# Patient Record
Sex: Female | Born: 1985 | Hispanic: Yes | Marital: Single | Smoking: Never smoker
Health system: Southern US, Community
[De-identification: ages and names within clinical notes are randomized; demographics above are authoritative.]

## PROBLEM LIST (undated history)

## (undated) DIAGNOSIS — Z789 Other specified health status: Secondary | ICD-10-CM

## (undated) HISTORY — PX: NO PAST SURGERIES: SHX2092

---

## 2018-03-26 NOTE — L&D Delivery Note (Addendum)
Upon arrival to L&D from [facility], patient 10cm/100/+1, intact. Intermittent urge to push. CNM waited for arrival of NICU team and AROM performed with large amount of bloody fluid. Patient pushed one time to deliver. Infant initially with vigorous cry and good tone. Cord clamping delayed by 60 second and clamped and cut by CNM. Infant taken to warmer for assessment by NICU team. See NICU note.   Delivery Note At 7:01 PM a viable female was delivered via Vaginal, Spontaneous (Presentation:  Right Occiput Anterior).  APGAR: 8,9, weight pending.   Placenta status: Spontaneous, Intact.  Cord: 3 vessels with the following complications: None.   Anesthesia: None Episiotomy: None Lacerations: None Suture Repair: n/a Est. Blood Loss (mL): 331  Brisk bright red bleeding immediately after delivery of placenta. Uterus boggy. IV pitocin and methergine given. Clots removed from lower uterine segment. Initially firm but on reassesment by RN, uterus boggy again. Buccal cytotec and TXA given. Uterus now firm with scant bleeding.   Mom to postpartum.  Baby to NICU.  Wende Mott CNM 03/22/2019, 7:32 PM

## 2018-09-30 ENCOUNTER — Encounter (HOSPITAL_COMMUNITY): Payer: Self-pay | Admitting: *Deleted

## 2018-09-30 ENCOUNTER — Inpatient Hospital Stay (HOSPITAL_COMMUNITY): Payer: Self-pay

## 2018-09-30 ENCOUNTER — Inpatient Hospital Stay (HOSPITAL_COMMUNITY)
Admission: AD | Admit: 2018-09-30 | Discharge: 2018-09-30 | Disposition: A | Discharge status: Home / Self Care | Payer: Self-pay | Attending: Obstetrics and Gynecology | Admitting: Obstetrics and Gynecology

## 2018-09-30 ENCOUNTER — Other Ambulatory Visit: Payer: Self-pay

## 2018-09-30 DIAGNOSIS — Z3A09 9 weeks gestation of pregnancy: Secondary | ICD-10-CM | POA: Insufficient documentation

## 2018-09-30 DIAGNOSIS — O209 Hemorrhage in early pregnancy, unspecified: Secondary | ICD-10-CM | POA: Insufficient documentation

## 2018-09-30 DIAGNOSIS — O98811 Other maternal infectious and parasitic diseases complicating pregnancy, first trimester: Secondary | ICD-10-CM | POA: Insufficient documentation

## 2018-09-30 DIAGNOSIS — O98819 Other maternal infectious and parasitic diseases complicating pregnancy, unspecified trimester: Secondary | ICD-10-CM

## 2018-09-30 DIAGNOSIS — B373 Candidiasis of vulva and vagina: Secondary | ICD-10-CM | POA: Insufficient documentation

## 2018-09-30 HISTORY — DX: Other specified health status: Z78.9

## 2018-09-30 LAB — WET PREP, GENITAL
Clue Cells Wet Prep HPF POC: NONE SEEN
Sperm: NONE SEEN
Trich, Wet Prep: NONE SEEN

## 2018-09-30 LAB — URINALYSIS, ROUTINE W REFLEX MICROSCOPIC
Bilirubin Urine: NEGATIVE
Glucose, UA: NEGATIVE mg/dL
Hgb urine dipstick: NEGATIVE
Ketones, ur: NEGATIVE mg/dL
Leukocytes,Ua: NEGATIVE
Nitrite: NEGATIVE
Protein, ur: NEGATIVE mg/dL
Specific Gravity, Urine: 1.016 (ref 1.005–1.030)
pH: 7 (ref 5.0–8.0)

## 2018-09-30 LAB — TYPE AND SCREEN
ABO/RH(D): O POS
Antibody Screen: NEGATIVE

## 2018-09-30 LAB — CBC
HCT: 39.4 % (ref 36.0–46.0)
Hemoglobin: 13.4 g/dL (ref 12.0–15.0)
MCH: 31.8 pg (ref 26.0–34.0)
MCHC: 34 g/dL (ref 30.0–36.0)
MCV: 93.6 fL (ref 80.0–100.0)
Platelets: 276 10*3/uL (ref 150–400)
RBC: 4.21 MIL/uL (ref 3.87–5.11)
RDW: 13.2 % (ref 11.5–15.5)
WBC: 8.1 10*3/uL (ref 4.0–10.5)
nRBC: 0 % (ref 0.0–0.2)

## 2018-09-30 LAB — POCT PREGNANCY, URINE: Preg Test, Ur: POSITIVE — AB

## 2018-09-30 LAB — HCG, QUANTITATIVE, PREGNANCY: hCG, Beta Chain, Quant, S: 93366 m[IU]/mL — ABNORMAL HIGH (ref <5)

## 2018-09-30 LAB — ABO/RH: ABO/RH(D): O POS

## 2018-09-30 MED ORDER — TERCONAZOLE 0.4 % VA CREA: 1.0000 | TOPICAL_CREAM | Freq: Every day | VAGINAL | 0 refills | Status: DC

## 2018-09-30 NOTE — Discharge Instructions (Signed)
Candidiasis vaginal en los adultos Vaginal Yeast infection, Adult  La infeccin mictica vaginal es una afeccin que causa secrecin vaginal y tambin dolor, hinchazn y enrojecimiento (inflamacin) de la vagina. Esta es una enfermedad frecuente. Algunas mujeres contraen esta infeccin con frecuencia. Cules son las causas? La causa de la infeccin es un cambio en el equilibrio normal de los hongos (cndida) y las bacterias que viven en la vagina. Esta alteracin deriva en el crecimiento excesivo de los hongos, lo que causa la inflamacin. Qu incrementa el riesgo? La afeccin es ms probable en las mujeres que tienen estas caractersticas:  Toman antibiticos.  Tienen diabetes.  Toman anticonceptivos orales.  Estn embarazadas.  Se hacen duchas vaginales con frecuencia.  Tienen debilitado el sistema de defensa del organismo (sistema inmunitario).  Han estado tomando medicamentos con corticoesteroides durante mucho tiempo.  Usan ropa ajustada con frecuencia. Cules son los signos o los sntomas? Los sntomas de esta afeccin incluyen los siguientes:  Secrecin vaginal blanca, cremosa y espesa.  Hinchazn, picazn, enrojecimiento e irritacin de la vagina. Los labios de la vagina (vulva) tambin se pueden infectar.  Dolor o ardor al orinar.  Dolor durante las relaciones sexuales. Cmo se diagnostica? Esta afeccin se diagnostica en funcin de lo siguiente:  Sus antecedentes mdicos.  Un examen fsico.  Examen plvico. El mdico examinar una muestra de la secrecin vaginal con un microscopio. Probablemente el mdico enve esta muestra al laboratorio para analizarla y confirmar el diagnstico. Cmo se trata? Esta afeccin se trata con medicamentos. Los medicamentos pueden ser recetados o de venta libre. Podrn indicarle que use uno o ms de lo siguiente:  Medicamentos que se toman por boca (orales).  Medicamentos que se aplican como una crema (tpicos).   Medicamentos que se colocan directamente en la vagina (vulos vaginales). Siga estas indicaciones en su casa:  Estilo de vida  No tenga relaciones sexuales hasta que el mdico lo autorice. Comunique a su compaero sexual que tiene una infeccin por hongos. Esa persona debera visitar a su mdico y preguntarle si debera recibir tratamiento tambin.  No use ropa ajustada, como pantimedias o pantalones ajustados.  Use ropa interior de algodn, que permite el paso del aire. Indicaciones generales  Tome o aplquese los medicamentos de venta libre y los recetados solamente como se lo haya indicado el mdico.  Consuma ms yogur. Esto puede ayudar a evitar la recurrencia de la candidiasis.  No use tampones hasta que el mdico la autorice.  Intente darse un bao de asiento para aliviar las molestias. Se trata de un bao de agua tibia que se toma mientras se est sentado. El agua solo debe llegar hasta las caderas y cubrir las nalgas. Hgalo 3o 4veces al da o como se lo haya indicado el mdico.  No se haga duchas vaginales.  Si tiene diabetes, mantenga bajo control los niveles de azcar en la sangre.  Concurra a todas las visitas de control como se lo haya indicado el mdico. Esto es importante. Comunquese con un mdico si:  Tiene fiebre.  Los sntomas desaparecen y luego vuelven a aparecer.  Los sntomas no mejoran con el tratamiento.  Sus sntomas empeoran.  Aparecen nuevos sntomas.  Aparecen ampollas alrededor o adentro de la vagina.  Le sale sangre de la vagina y no est menstruando.  Siente dolor en el abdomen. Resumen  La infeccin mictica vaginal es una afeccin que causa secrecin y tambin dolor, hinchazn y enrojecimiento (inflamacin) de la vagina.  Esta afeccin se trata con medicamentos. Los   medicamentos pueden ser recetados o de venta libre.  Tome o aplquese los medicamentos de venta libre y los recetados solamente como se lo haya indicado el mdico.  No  se haga duchas vaginales. No tenga relaciones sexuales ni use tampones hasta que el mdico la autorice.  Comunquese con un mdico si los sntomas no mejoran con el tratamiento o si los sntomas desaparecen y luego regresan. Esta informacin no tiene como fin reemplazar el consejo del mdico. Asegrese de hacerle al mdico cualquier pregunta que tenga. Document Released: 12/20/2004 Document Revised: 09/19/2017 Document Reviewed: 09/19/2017 Elsevier Patient Education  2020 [employer]  

## 2018-09-30 NOTE — MAU Provider Note (Signed)
History     CSN: [id number]  Arrival date and time: 09/30/18 1110   First Provider Initiated Contact with Patient 09/30/18 1239      Chief Complaint  Patient presents with  . Vaginal Bleeding  . Abdominal Pain   Deanna Obrien is a 33 y.o. G3P2002 at 1848w1d by definite LMP .  She presents today for Vaginal Bleeding and Abdominal Pain.  She states she had a positive pregnancy test at home about one month ago and started having bleeding yesterday.  She states it was "two drops of blood and wetness."  She states she had sexual activity 48 hours ago and did not have any bleeding today.  Patient reports she had some pain in her lower abdomen and in her waist line, but none currently.  She states she did not take any medication for the pain.      OB History    Gravida  3   Para  2   Term  2   Preterm      AB      Living        SAB      TAB      Ectopic      Multiple      Live Births              Past Medical History:  Diagnosis Date  . Medical history non-contributory     Past Surgical History:  Procedure Laterality Date  . NO PAST SURGERIES      Family History  Problem Relation Age of Onset  . Cirrhosis Father     Social History   Tobacco Use  . Smoking status: Never Smoker  . Smokeless tobacco: Never Used  Substance Use Topics  . Alcohol use: Not Currently  . Drug use: Never    Allergies: No Known Allergies  No medications prior to admission.    Review of Systems  Constitutional: Negative for chills and fever.  Eyes: Negative for visual disturbance.  Respiratory: Negative for cough and shortness of breath.   Gastrointestinal: Positive for nausea. Negative for abdominal pain, constipation, diarrhea and vomiting.  Genitourinary: Positive for vaginal discharge (White, thin, and nonodorous). Negative for difficulty urinating, dysuria and vaginal bleeding.  Neurological: Negative for dizziness, light-headedness and headaches.   Physical  Exam   Blood pressure 134/78, pulse 74, temperature 98.8 F (37.1 C), temperature source Oral, resp. rate 17, weight 74 kg, last menstrual period 07/28/2018, SpO2 100 %.  Physical Exam  Constitutional: She is oriented to person, place, and time. She appears well-developed and well-nourished.  HENT:  Head: Normocephalic and atraumatic.  Eyes: Conjunctivae are normal.  Neck: Normal range of motion.  Cardiovascular: Normal rate.  Respiratory: Effort normal.  GI: Soft.  Genitourinary: Cervix exhibits friability. Cervix exhibits no discharge.    Vaginal discharge present.     No vaginal bleeding.  No bleeding in the vagina.    Genitourinary Comments: Speculum Exam: -Vaginal Vault: Pink mucosa.  Small amt thin curdy white discharge -wet prep collected -Cervix:Pink, no lesions, cysts, or polyps.  Appears closed. No active bleeding from os-GC/CT collected. Friable bleeding noted -Bimanual Exam: Closed   Musculoskeletal: Normal range of motion.  Neurological: She is alert and oriented to person, place, and time.  Skin: Skin is warm and dry.  Psychiatric: She has a normal mood and affect. Her behavior is normal.    MAU Course  Procedures Results for orders placed or performed during the hospital encounter  of 09/30/18 (from the past 24 hour(s))  Pregnancy, urine POC     Status: Abnormal   Collection Time: 09/30/18 12:00 PM  Result Value Ref Range   Preg Test, Ur POSITIVE (A) NEGATIVE  Urinalysis, Routine w reflex microscopic     Status: None   Collection Time: 09/30/18 12:07 PM  Result Value Ref Range   Color, Urine YELLOW YELLOW   APPearance CLEAR CLEAR   Specific Gravity, Urine 1.016 1.005 - 1.030   pH 7.0 5.0 - 8.0   Glucose, UA NEGATIVE NEGATIVE mg/dL   Hgb urine dipstick NEGATIVE NEGATIVE   Bilirubin Urine NEGATIVE NEGATIVE   Ketones, ur NEGATIVE NEGATIVE mg/dL   Protein, ur NEGATIVE NEGATIVE mg/dL   Nitrite NEGATIVE NEGATIVE   Leukocytes,Ua NEGATIVE NEGATIVE  CBC      Status: None   Collection Time: 09/30/18 12:15 PM  Result Value Ref Range   WBC 8.1 4.0 - 10.5 K/uL   RBC 4.21 3.87 - 5.11 MIL/uL   Hemoglobin 13.4 12.0 - 15.0 g/dL   HCT 39.4 36.0 - 46.0 %   MCV 93.6 80.0 - 100.0 fL   MCH 31.8 26.0 - 34.0 pg   MCHC 34.0 30.0 - 36.0 g/dL   RDW 13.2 11.5 - 15.5 %   Platelets 276 150 - 400 K/uL   nRBC 0.0 0.0 - 0.2 %   US Ob Transvaginal  Result Date: 09/30/2018 CLINICAL DATA:  Vaginal bleeding, first trimester of pregnancy. EXAM: TRANSVAGINAL OB ULTRASOUND TECHNIQUE: Transvaginal ultrasound was performed for complete evaluation of the gestation as well as the maternal uterus, adnexal regions, and pelvic cul-de-sac. COMPARISON:  None. FINDINGS: Intrauterine gestational sac: Single visualized. Yolk sac:  Visualized. Embryo:  Visualized. Cardiac Activity: Visualized. Heart Rate: 176 bpm CRL:   21.8 mm   8 w 5 d                  [place] EDC: May 07, 2019. Subchorionic hemorrhage:  None visualized. Maternal uterus/adnexae: Probable corpus luteum cyst seen in right ovary. Left ovary is unremarkable. Minimal free fluid is noted which most likely is physiologic. Possible 1.9 cm paraovarian cyst is noted. IMPRESSION: Single live intrauterine gestation of 8 weeks 5 days. Electronically Signed   By: Marijo Conception M.D.   On: 09/30/2018 13:59    MDM Pelvic Exam with cultures Labs: UA, CBC, T&S, hCG, Wet prep, and GC/CT  Assessment and Plan  33 year old G3P2002 at 9.1 weeks by LMP Vaginal Bleeding-Resolved Abdominal Pain-Resolved Candidiasis of Vagina Friable Cervix  -Exam findings discussed. -Patient informed of apparent candidiasis of vagina. -Informed of friable cervix and how this may contribute to light spotting after sex.  -Labs collected and sent. -Will send for [place]. -Will await results.   Follow Up (2:15 PM)  -Wet prep returns significant for yeast buds -[place] returns with dates c/w LMP and no s/s of [employer] -Nurse instructed to inform patient of  findings and give bleeding precautions. -Encouraged to call or return to MAU if symptoms worsen or with the onset of new symptoms. -Discharged to home in stable condition.  Maryann Conners MSN, CNM 09/30/2018, 12:39 PM

## 2018-09-30 NOTE — MAU Note (Signed)
Called pt at [phone]  At registration desk, now waiting on translator in triage.

## 2018-09-30 NOTE — MAU Note (Signed)
Plan explained to pt, will collect urine- rpt preg test.  Pt to lobby to wait on results.  May draw blood work while waiting.

## 2018-09-30 NOTE — MAU Note (Signed)
Last period was May 4,  +HPT.  Last night she was not feeling well, felt like something was coming down, had a small amt of blood. Very little pain in lower abd, no bleeding today.

## 2018-10-01 LAB — GC/CHLAMYDIA PROBE AMP (~~LOC~~) NOT AT ARMC
Chlamydia: NEGATIVE
Neisseria Gonorrhea: NEGATIVE

## 2018-10-07 ENCOUNTER — Telehealth: Payer: Self-pay | Admitting: Family Medicine

## 2018-10-07 NOTE — Telephone Encounter (Signed)
Patient called in needing a Spanish interpreter. Spanish interpreter ID # [id number] was used for the call. Patient stated that she went to MAU because she is pregnant and she was bleeding. Patient stated that they gave her some medicine. Patient stated that she is having some bleeding again today. Spoke with Pam about this matter, and she stated that the patient did not have to go to MAU again is she is not soaking through pads and only has bleeding when she wipes. Patient was instructed of this information and instructed if she feels as if she needs to return to MAU than she can but right now it is not recommended. Patient verbalized understanding. Patient was scheduled to start prenatal care at our office. Intake is scheduled for 7/15 @ 10:30. Patient instructed that the visit is virtual and the [facility] app was downloaded while we were on the phone.

## 2018-10-08 ENCOUNTER — Inpatient Hospital Stay (HOSPITAL_COMMUNITY)
Admission: AD | Admit: 2018-10-08 | Discharge: 2018-10-08 | Disposition: A | Discharge status: Home / Self Care | Payer: Self-pay | Attending: Obstetrics and Gynecology | Admitting: Obstetrics and Gynecology

## 2018-10-08 ENCOUNTER — Encounter (HOSPITAL_COMMUNITY): Payer: Self-pay

## 2018-10-08 ENCOUNTER — Other Ambulatory Visit: Payer: Self-pay

## 2018-10-08 ENCOUNTER — Ambulatory Visit (INDEPENDENT_AMBULATORY_CARE_PROVIDER_SITE_OTHER): Payer: Self-pay | Admitting: *Deleted

## 2018-10-08 DIAGNOSIS — Z3A10 10 weeks gestation of pregnancy: Secondary | ICD-10-CM | POA: Insufficient documentation

## 2018-10-08 DIAGNOSIS — O209 Hemorrhage in early pregnancy, unspecified: Secondary | ICD-10-CM | POA: Insufficient documentation

## 2018-10-08 DIAGNOSIS — O4690 Antepartum hemorrhage, unspecified, unspecified trimester: Secondary | ICD-10-CM

## 2018-10-08 LAB — URINALYSIS, ROUTINE W REFLEX MICROSCOPIC
Bilirubin Urine: NEGATIVE
Glucose, UA: NEGATIVE mg/dL
Hgb urine dipstick: NEGATIVE
Ketones, ur: NEGATIVE mg/dL
Leukocytes,Ua: NEGATIVE
Nitrite: NEGATIVE
Protein, ur: NEGATIVE mg/dL
Specific Gravity, Urine: 1.012 (ref 1.005–1.030)
pH: 7 (ref 5.0–8.0)

## 2018-10-08 NOTE — Discharge Instructions (Signed)
Primer trimestre de embarazo °First Trimester of Pregnancy °El primer trimestre de embarazo se extiende desde la semana 1 hasta el final de la semana 13 (mes 1 al mes 3). Una semana después de que un espermatozoide fecunda un óvulo, este se implantará en la pared uterina. Este embrión comenzará a desarrollarse hasta convertirse en un bebé. Sus genes y los de su pareja forman el bebé. Los genes del varón determinan si será un niño o una niña. Entre la semana 6 y la 8, se forman los ojos y el rostro, y los latidos del corazón pueden verse en una ecografía. Al final de las 12 semanas, todos los órganos del bebé están formados. °Ahora que está embarazada, querrá hacer todo lo que esté a su alcance para tener un bebé sano. Dos de las cosas más importantes son tener un buen cuidado prenatal y seguir las indicaciones del médico. El cuidado prenatal incluye toda la asistencia médica que usted recibe antes del nacimiento del bebé. Esto ayudará a prevenir, detectar y tratar cualquier problema durante el embarazo y el parto. °Durante el primer trimestre, ocurren cambios en el cuerpo. °Su organismo atraviesa por muchos cambios durante el embarazo. Estos cambios varían de una mujer a otra. °· Al principio, puede aumentar o bajar algunos kilos. °· Puede tener malestar estomacal (náuseas) y vomitar. Si no puede controlar los vómitos, llame al médico. °· Puede cansarse con facilidad. °· Es posible que tenga dolores de cabeza que pueden aliviarse con ciertos medicamentos. Todos los medicamentos que tome deben estar aprobados por el médico. °· Puede orinar con mayor frecuencia. El dolor al orinar puede significar que usted tiene una infección de la vejiga. °· Debido al embarazo, puede tener acidez estomacal. °· Puede estar estreñida, ya que ciertas hormonas enlentecen los movimientos de los músculos que empujan las heces a través del intestino. °· Pueden aparecer hemorroides o hincharse las venas (venas varicosas). °· Las mamas  pueden empezar a agrandarse y estar sensibles. Los pezones pueden sobresalir más, y el tejido que los rodea (aréola) tornarse más oscuro. °· Las encías pueden sangrar y estar sensibles al cepillado y al hilo dental. °· Pueden aparecer zonas oscuras o manchas (cloasma, máscara del embarazo) en el rostro. Esto probablemente se atenuará después del nacimiento del bebé. °· Los períodos menstruales se interrumpirán. °· Tal vez no tenga apetito. °· Puede sentir un fuerte deseo de consumir ciertos alimentos. °· Puede tener cambios a nivel emocional día a día, por ejemplo, por momentos puede estar emocionada por el embarazo y por otros preocuparse porque algo pueda salir mal con el embarazo o el bebé. °· Tendrá sueños más vívidos y extraños. °· Tal vez haya cambios en el cabello. Esto cambios pueden incluir su engrosamiento, crecimiento rápido y cambios en la textura. Además, a algunas mujeres se les cae el cabello durante o después del embarazo, o tienen el cabello seco o fino. Lo más probable es que el cabello se le normalice después del nacimiento del bebé. °Qué debe esperar en las visitas prenatales °Durante una visita prenatal de rutina: °· La pesarán para asegurarse de que usted y el bebé están creciendo normalmente. °· Le tomarán la presión arterial. °· Le medirán el abdomen para controlar el desarrollo del bebé. °· Se escucharán los latidos cardíacos fetales entre las semanas 10 y 14 de embarazo. °· Se analizarán los resultados de los estudios solicitados en visitas anteriores. °El médico puede preguntarle lo siguiente: °· Cómo se siente. °· Si siente los movimientos del bebé. °· Si ha tenido síntomas anormales, como pérdida de líquido, sangrado,   dolores de cabeza intensos o cólicos abdominales. °· Si está consumiendo algún producto que contenga tabaco, como cigarrillos, tabaco de mascar y cigarrillos electrónicos. °· Si tiene alguna pregunta. °Otros estudios que pueden realizarse durante el primer trimestre  incluyen lo siguiente: °· Análisis de sangre para determinar el grupo sanguíneo y detectar la presencia de infecciones previas. Las pruebas también se utilizarán para determinar si tiene bajo nivel de hierro (anemia) y proteínas en los glóbulos rojos (anticuerpos Rh). En función de sus factores de riesgo, o si ya tuvo diabetes durante un embarazo anterior, le pueden hacer pruebas para determinar si tiene un nivel alto de azúcar en la sangre, algo que puede afectar a embarazadas (diabetes gestacional). °· Análisis de orina para detectar infecciones, diabetes o proteínas en la orina. °· Una ecografía para confirmar que el bebé crece y se desarrolla correctamente. °· Estudios fetales para detectar problemas de la médula espinal (espina bífida) y síndrome de Down. °· Prueba del VIH (virus de inmunodeficiencia humana). Los exámenes prenatales de rutina incluyen la prueba de detección del VIH, a menos que decida no realizársela. °· Es posible que necesite otras pruebas adicionales. °Siga estas instrucciones en su casa: °Medicamentos °· Siga las indicaciones del médico en relación con el uso de medicamentos. Durante el embarazo, hay medicamentos que pueden tomarse y otros que no. °· Tome vitaminas prenatales que contengan por lo menos 600 microgramos (?g) de ácido fólico. °· Si está estreñida, tome un laxante suave, si el médico lo autoriza. °Comida y bebida ° °· Lleve una dieta equilibrada que incluya gran cantidad de frutas y verduras frescas, cereales integrales, buenas fuentes de proteínas como carnes magras, huevos o tofu, y lácteos descremados. El médico la ayudará a determinar la cantidad de peso que puede aumentar. °· No coma carne cruda ni quesos sin cocinar. Estos elementos contienen gérmenes que pueden causar defectos congénitos en el bebé. °· La ingesta diaria de cuatro o cinco comidas pequeñas en lugar de tres comidas abundantes puede ayudar a aliviar las náuseas y los vómitos. Si empieza a tener náuseas,  comer algunas galletas saladas puede ser de ayuda. Beber líquidos entre las comidas, en lugar de tomarlos durante las comidas, también puede ayudar a aliviar las náuseas y los vómitos. °· Limite el consumo de alimentos con alto contenido de grasas y azúcares procesados, como alimentos fritos o dulces. °· Para evitar el estreñimiento: °? Consuma alimentos ricos en fibra, como frutas y verduras frescas, cereales integrales y frijoles. °? Beba suficiente líquido para mantener la orina clara o de color amarillo pálido. °Actividad °· Haga ejercicio solamente como se lo haya indicado el médico. La mayoría de las mujeres pueden continuar su rutina de ejercicios durante el embarazo. Intente realizar como mínimo 30 minutos de actividad física por lo menos 5 días a la semana. El ejercicio la ayudará a: °? Controlar su peso. °? Mantenerse en forma. °? Estar preparada para el trabajo de parto y el parto. °· Los dolores, los cólicos en la parte baja del abdomen o los calambres en la cintura son un buen indicio de que debe dejar de hacer ejercicios. Consulte al médico antes de seguir haciendo ejercicios con normalidad. °· Intente no estar de pie durante mucho tiempo. Mueva las piernas con frecuencia si debe estar de pie en un lugar durante mucho tiempo. °· Evite levantar pesos excesivos. °· Use zapatos de tacones bajos y mantenga una buena postura. °· Puede seguir teniendo relaciones sexuales, salvo que el médico le indique lo contrario. °Alivio del dolor y del malestar °·   Use un sostén que le brinde buen soporte para aliviar el dolor de mamas. °· Dese baños de asiento con agua tibia para aliviar el dolor o las molestias causadas por las hemorroides. Use una crema para las hemorroides si el médico la autoriza. °· Descanse con las piernas elevadas si tiene calambres o dolor de cintura. °· Si tiene venas varicosas en las piernas, use medias de descanso. Eleve los pies durante 15 minutos, 3 o 4 veces por día. Limite el consumo de  sal en su dieta. °Cuidados prenatales °· Programe las visitas prenatales para la semana 12 de embarazo. Generalmente se programan cada mes al principio y se hacen más frecuentes en los 2 últimos meses antes del parto. °· Escriba sus preguntas. Llévelas cuando concurra a las visitas prenatales. °· Concurra a todas las visitas prenatales tal como se lo haya indicado el médico. Esto es importante. °Seguridad °· Use el cinturón de seguridad en todo momento mientras conduce. °· Haga una lista de los números de teléfono de emergencia, que incluya los números de teléfono de familiares, amigos, el hospital y los departamentos de policía y bomberos. °Instrucciones generales °· Pídale al médico que la derive a clases de educación prenatal en su localidad. Debe comenzar a tomar las clases antes de que empiece el mes 6 de embarazo. °· Pida ayuda si tiene necesidades nutricionales o de asesoramiento durante el embarazo. El médico puede aconsejarla o derivarla a especialistas para que la ayuden con diferentes necesidades. °· No se dé baños de inmersión en agua caliente, baños turcos ni saunas. °· No se haga duchas vaginales ni use tampones o toallas higiénicas perfumadas. °· No mantenga las piernas cruzadas durante mucho tiempo. °· Evite el contacto con las bandejas sanitarias de los gatos y la tierra que estos animales usan. Estos elementos contienen bacterias que pueden causar defectos congénitos al bebé y la posible pérdida del feto debido a un aborto espontáneo o muerte fetal. °· No fume, no consuma hierbas ni medicamentos que no hayan sido recetados por el médico. Las sustancias químicas que estos productos contienen afectan la formación y el desarrollo del bebé. °· No consuma ningún producto que contenga nicotina o tabaco, como cigarrillos y cigarrillos electrónicos. Si necesita ayuda para dejar de fumar, consulte al médico. Puede recibir asesoramiento y otro tipo de recursos para dejar de fumar. °· Programe una cita con el  dentista. En su casa, lávese los dientes con un cepillo dental blando y pásese el hilo dental con suavidad. °Comuníquese con un médico si: °· Tiene mareos. °· Siente cólicos leves, presión en la pelvis o dolor persistente en el abdomen. °· Tiene náuseas, vómitos o diarrea persistentes. °· Observa una secreción vaginal con mal olor. °· Siente dolor al orinar. °· Observa más hinchazón en la cara, las manos, las piernas o los tobillos. °· Está expuesta a la quinta enfermedad o a la varicela. °· Está expuesta al sarampión alemán (rubéola) y nunca lo había tenido. °Solicite ayuda de inmediato si: °· Tiene fiebre. °· Tiene una pérdida de líquido por la vagina. °· Tiene sangrado o pequeñas pérdidas vaginales. °· Siente dolor intenso o cólicos en el abdomen. °· Sube o baja de peso rápidamente. °· Vomita sangre de color rojo brillante o una sustancia similar a los granos de café. °· Dolor de cabeza intenso. °· Le falta el aire. °· Sufre cualquier tipo de traumatismo, por ejemplo, debido a una caída o un accidente automovilístico. °Resumen °· El primer trimestre de embarazo se extiende desde la semana 1 hasta el final de la   semana 13 (mes 1 al mes 3). °· Su organismo atraviesa por muchos cambios durante el embarazo. Estos cambios varían de una mujer a otra. °· Tendrá visitas prenatales de rutina. Durante esas visitas, el médico la examinará, hablará con usted acerca de los resultados de sus pruebas y le preguntará cómo se siente. °Esta información no tiene como fin reemplazar el consejo del médico. Asegúrese de hacerle al médico cualquier pregunta que tenga. °Document Released: 12/20/2004 Document Revised: 06/15/2016 Document Reviewed: 06/15/2016 °Elsevier Patient Education © 2020 [employer] ° °

## 2018-10-08 NOTE — Progress Notes (Signed)
10:27am Unclear per chart review if she speaks Spanish and needs interpreter or speaks [place]-  I called Corrine and confirmed she speak Spanish and needs interpreter . 10:29 I called her back with interpreter and we dialed her number twice and heard a message voicemail not set up. Linda,RN 10: 68 I called back again with [employer] interepreter and she is not answering and hear message voicemail not set up. I called Quinetta also and heard message voicemail not set up. Linda,RN 10:50 I called back again without interpreter and she answered and said no speak [place]- I said calling from [facility]- I am trying to call you with interpreter and will call back with interpreter -please answer your phone- will be different number. Linda,RN 10:52 I called with [employer] and she answered and we began visit. I connected with  Bearl Mulberry on 10/08/18 at 10:30 AM EDT by telephone and verified that I am speaking with the correct person using two identifiers.   I discussed the limitations, risks, security and privacy concerns of performing an evaluation and management service by telephone and the availability of in person appointments. I also discussed with the patient that there may be a patient responsible charge related to this service. The patient expressed understanding and agreed to proceed. I confirmed EDD and that she had bleeding 7/6 and 7/7 and went to MAU and no bleeding since then until 10/06/18 . States on 7/13 had a little bit of bleeding. Then 10/07/18 when wiped had blood for 3 hours everytime when she went to the bathroom, but none on panties. Then today when she woke up she felt something and went to bathroom and was bleeding- states it lasted 2- 3 hours then stopped.  States she feels pain in her waist when she is bleeding. She confimed that actually yesterday was not just spotting but had bleeding also for 2-3 hours.  States the pain is mild. She states the bleeding is bright red.  I  advised her to go to MAU for evaluation for possible miscarriage as she is having more than spotting and is having pain. It took several minutes of asking/ answering questions to clarify her bleeding.  She voices understanding.  Linda,RN 10/08/2018  10:54 AM

## 2018-10-08 NOTE — Progress Notes (Signed)
Patient seen and assessed by nursing staff during this encounter. I have reviewed the chart and agree with the documentation and plan.  Aletha Halim, MD 10/08/2018 2:27 PM

## 2018-10-08 NOTE — MAU Note (Signed)
Deanna Obrien is a 33 y.o. at [redacted]w[redacted]d here in MAU reporting: started having vaginal bleeding since Sunday, it is intermittent. Notices it when she wipes in the bathroom. States every once in a while she feels like something is coming down, early on Monday the bleeding was bright red with clots, no clots since then. Has some pain when she feels some blood is coming down, pain is intermittent. No recent IC  Onset of complaint: since Sunday   Pain score: 4/10  Vitals:   10/08/18 1205  BP: 114/70  Pulse: 74  Resp: 18  Temp: 98.7 F (37.1 C)  SpO2: 100%     [id number] attempted, unable to obtain FHT  Lab orders placed from triage: UA

## 2018-10-08 NOTE — MAU Note (Signed)
Spanish interpretor notified of pt arrival, Deanna Obrien, states she is on her way

## 2018-10-08 NOTE — MAU Note (Signed)
Norman Herrlich CNM informs this RN that she saw [place] with bedside u/s. Plans to dc pt home

## 2018-10-08 NOTE — MAU Provider Note (Signed)
History     CSN: [id number]  Arrival date and time: 10/08/18 1129   First Provider Initiated Contact with Patient 10/08/18 1213      Chief Complaint  Patient presents with  . Abdominal Pain  . Vaginal Bleeding   Deanna Obrien is a 33 y.o. G3P2000 at [redacted]w[redacted]d who presents today with vaginal bleeding. She states that she was seen here 8 days ago with bleeding. That stopped and then 3 days ago she started bleeding again. On Monday she passed a few clots about the size of a pea. She denies any pain at this time.   Vaginal Bleeding The patient's primary symptoms include vaginal bleeding. This is a new problem. Episode onset: 3 days ago. The problem occurs intermittently. The problem has been unchanged. She is pregnant. The vaginal discharge was bloody. The vaginal bleeding is lighter than menses. She has been passing clots (Monday she passed a few about the size of a pea. ). Nothing aggravates the symptoms. She has tried nothing for the symptoms. Menstrual history: 07/28/2018.    OB History    Gravida  3   Para  2   Term  2   Preterm      AB      Living        SAB      TAB      Ectopic      Multiple      Live Births              Past Medical History:  Diagnosis Date  . Medical history non-contributory     Past Surgical History:  Procedure Laterality Date  . NO PAST SURGERIES      Family History  Problem Relation Age of Onset  . Cirrhosis Father     Social History   Tobacco Use  . Smoking status: Never Smoker  . Smokeless tobacco: Never Used  Substance Use Topics  . Alcohol use: Not Currently  . Drug use: Never    Allergies: No Known Allergies  Medications Prior to Admission  Medication Sig Dispense Refill Last Dose  . terconazole (TERAZOL 7) 0.4 % vaginal cream Place 1 applicator vaginally at bedtime. 45 g 0     Review of Systems  Genitourinary: Positive for vaginal bleeding.   Physical Exam   Blood pressure 114/70, pulse 74,  temperature 98.7 F (37.1 C), temperature source Oral, resp. rate 18, height 5\' 1"  (1.549 m), weight 73.6 kg, last menstrual period 07/28/2018, SpO2 100 %.  Physical Exam  Nursing note and vitals reviewed. Constitutional: She is oriented to person, place, and time. She appears well-developed and well-nourished. No distress.  HENT:  Head: Normocephalic.  Cardiovascular: Normal rate.  Respiratory: Effort normal.  GI: Soft. There is no abdominal tenderness. There is no rebound.  Neurological: She is alert and oriented to person, place, and time.  Skin: Skin is warm and dry.  Psychiatric: She has a normal mood and affect.   Pt informed that the ultrasound is considered a limited OB ultrasound and is not intended to be a complete ultrasound exam.  Patient also informed that the ultrasound is not being completed with the intent of assessing for fetal or placental anomalies or any pelvic abnormalities.  Explained that the purpose of today's ultrasound is to assess for  viability.  Patient acknowledges the purpose of the exam and the limitations of the study.    +FHT 140 CRL 10 weeks 1 day which is consistent with prior  dating.  MAU Course  Procedures  MDM   Assessment and Plan   1. Vaginal bleeding in pregnancy, first trimester   2. [redacted] weeks gestation of pregnancy    Blood type O+  DC home 1st Trimester precautions  Bleeding precautions RX: none  Return to MAU as needed FU with OB as planned  Follow-up Information    [facility].   Specialty: Obstetrics and Gynecology Contact information: [address] [id number] [place] [id number] [phone]         Thressa ShellerHeather  DNP, CNM  10/08/18  12:32 PM

## 2018-10-09 ENCOUNTER — Ambulatory Visit (INDEPENDENT_AMBULATORY_CARE_PROVIDER_SITE_OTHER): Payer: Self-pay | Admitting: *Deleted

## 2018-10-09 ENCOUNTER — Other Ambulatory Visit: Payer: Self-pay

## 2018-10-09 DIAGNOSIS — Z3490 Encounter for supervision of normal pregnancy, unspecified, unspecified trimester: Secondary | ICD-10-CM

## 2018-10-09 DIAGNOSIS — O209 Hemorrhage in early pregnancy, unspecified: Secondary | ICD-10-CM | POA: Insufficient documentation

## 2018-10-09 NOTE — Progress Notes (Signed)
I connected with  Bearl Mulberry on 10/09/18 at 10:30 AM EDT by telephone and verified that I am speaking with the correct person using two identifiers.   I discussed the limitations, risks, security and privacy concerns of performing an evaluation and management service by telephone and virtually and the availability of in person appointments. I also discussed with the patient that there may be a patient responsible charge related to this service. The patient expressed understanding and agreed to proceed. We were not able to connect completely on Webex so we completed by telephone.   Anicia Leuthold,RN 10/09/2018  10:26 AM

## 2018-10-14 ENCOUNTER — Other Ambulatory Visit: Payer: Self-pay

## 2018-10-14 ENCOUNTER — Ambulatory Visit: Payer: Self-pay | Admitting: Clinical

## 2018-10-14 DIAGNOSIS — Z5329 Procedure and treatment not carried out because of patient's decision for other reasons: Secondary | ICD-10-CM

## 2018-10-14 DIAGNOSIS — Z91199 Patient's noncompliance with other medical treatment and regimen due to unspecified reason: Secondary | ICD-10-CM

## 2018-10-14 NOTE — BH Specialist Note (Signed)
Pt did not answer phone to set up video visit. Per Cheri Kearns, [phone], pt's phone rang with no voicemail, so no message left. Pt does not have MyChart, so no MyChart message left.   [place] via Telemedicine Video Visit  10/14/2018 Deanna Obrien [id number]   [name]

## 2018-10-21 ENCOUNTER — Ambulatory Visit (INDEPENDENT_AMBULATORY_CARE_PROVIDER_SITE_OTHER): Payer: Self-pay | Admitting: Advanced Practice Midwife

## 2018-10-21 ENCOUNTER — Encounter: Payer: Self-pay | Admitting: Advanced Practice Midwife

## 2018-10-21 ENCOUNTER — Other Ambulatory Visit: Payer: Self-pay

## 2018-10-21 VITALS — BP 122/73 | HR 76 | Temp 98.4°F | Wt 163.1 lb

## 2018-10-21 DIAGNOSIS — Z3490 Encounter for supervision of normal pregnancy, unspecified, unspecified trimester: Secondary | ICD-10-CM

## 2018-10-21 DIAGNOSIS — Z1151 Encounter for screening for human papillomavirus (HPV): Secondary | ICD-10-CM

## 2018-10-21 DIAGNOSIS — Z3491 Encounter for supervision of normal pregnancy, unspecified, first trimester: Secondary | ICD-10-CM

## 2018-10-21 DIAGNOSIS — Z3A12 12 weeks gestation of pregnancy: Secondary | ICD-10-CM

## 2018-10-21 DIAGNOSIS — Z124 Encounter for screening for malignant neoplasm of cervix: Secondary | ICD-10-CM

## 2018-10-21 NOTE — Progress Notes (Signed)
Pt has applied for Pregnancy Medicaid, will waite until approved to get her BP Cuff.

## 2018-10-21 NOTE — Progress Notes (Signed)
Subjective:   Deanna Obrien is a 33 y.o. G3P2002 at [redacted]w[redacted]d by LMP, early ultrasound being seen today for her first obstetrical visit.  Her obstetrical history is significant for none. Patient does intend to breast feed. Pregnancy history fully reviewed.  Patient reports no complaints.  Patient has been seen in MAU for a couple of visits with some bleeding. She states that has stopped now. Formal US on 09/30/2018, and bedside US on 10/08/2018 both with cardiac activity.   HISTORY: OB History  Gravida Para Term Preterm AB Living  3 2 2  0 0 2  SAB TAB Ectopic Multiple Live Births  0 0 0 0 2    # Outcome Date GA Lbr Len/2nd Weight Sex Delivery Anes PTL Lv  3 Current           2 Term 12/17/14   7 lb 11.5 oz (3.5 kg)  Vag-Spont None  LIV     Birth Comments: no complications  1 Term 99/8338 [redacted]w[redacted]d  5 lb 8.2 oz (2.5 kg)  Vag-Spont None  LIV     Birth Comments: no complications     Past Medical History:  Diagnosis Date  . Medical history non-contributory    Past Surgical History:  Procedure Laterality Date  . NO PAST SURGERIES     Family History  Problem Relation Age of Onset  . Cirrhosis Father   . Varicose Veins Father    Social History   Tobacco Use  . Smoking status: Never Smoker  . Smokeless tobacco: Never Used  Substance Use Topics  . Alcohol use: Not Currently  . Drug use: Never   No Known Allergies Current Outpatient Medications on File Prior to Visit  Medication Sig Dispense Refill  . Prenatal Vit-Fe Fumarate-FA (PRENATAL VITAMINS PO) Take 1 tablet by mouth daily.    Marland Kitchen terconazole (TERAZOL 7) 0.4 % vaginal cream Place 1 applicator vaginally at bedtime. 45 g 0   No current facility-administered medications on file prior to visit.     Review of Systems Pertinent items noted in HPI and remainder of comprehensive ROS otherwise negative.  Exam   Vitals:   10/21/18 1022  BP: 122/73  Pulse: 76  Temp: 98.4 F (36.9 C)  Weight: 163 lb 1.6 oz (74 kg)    Fetal Heart Rate (bpm): 155  Physical Exam  Constitutional: She is oriented to person, place, and time and well-developed, well-nourished, and in no distress. No distress.  Cardiovascular: Normal rate.  Pulmonary/Chest: Effort normal.  Abdominal: Soft. There is no abdominal tenderness. There is no rebound.  Genitourinary:    Genitourinary Comments:  External: no lesion Vagina: small amount of white discharge Cervix: pink, smooth, no CMT Uterus: AGA    Neurological: She is alert and oriented to person, place, and time.  Skin: Skin is warm and dry.  Psychiatric: Affect normal.  Nursing note and vitals reviewed.  +FHT 155 with doppler   Assessment:   Pregnancy: [id number] Patient Active Problem List   Diagnosis Date Noted  . Supervision of low-risk pregnancy 10/09/2018  . Vaginal bleeding in pregnancy, first trimester 10/09/2018     Plan:  1. Encounter for supervision of low-risk pregnancy, antepartum - Culture, OB Urine - Genetic Screening - Obstetric Panel, Including HIV - Cytology - PAP - AFP, Serum, Open Spina Bifida; Future   Initial labs drawn. Continue prenatal vitamins. Genetic Screening discussed, AFP and NIPS: ordered. Ultrasound discussed; fetal anatomic survey: ordered. Problem list reviewed and updated. The nature [facility] with multiple MDs and other Advanced Practice Providers was explained to patient; also emphasized that residents, students are part of our team. Routine obstetric precautions reviewed. 50% of 45 min visit spent in counseling and coordination of care. Return in about 8 weeks (around 12/16/2018) for Virtual OB visit .  Thressa ShellerHeather Sabella Traore DNP, CNM  10/21/18  10:57 AM

## 2018-10-22 LAB — OBSTETRIC PANEL, INCLUDING HIV
Antibody Screen: NEGATIVE
Basophils Absolute: 0 10*3/uL (ref 0.0–0.2)
Basos: 0 %
EOS (ABSOLUTE): 0.1 10*3/uL (ref 0.0–0.4)
Eos: 1 %
HIV Screen 4th Generation wRfx: NONREACTIVE
Hematocrit: 38.3 % (ref 34.0–46.6)
Hemoglobin: 12.6 g/dL (ref 11.1–15.9)
Hepatitis B Surface Ag: NEGATIVE
Immature Grans (Abs): 0 10*3/uL (ref 0.0–0.1)
Immature Granulocytes: 0 %
Lymphocytes Absolute: 1.7 10*3/uL (ref 0.7–3.1)
Lymphs: 22 %
MCH: 31.3 pg (ref 26.6–33.0)
MCHC: 32.9 g/dL (ref 31.5–35.7)
MCV: 95 fL (ref 79–97)
Monocytes Absolute: 0.7 10*3/uL (ref 0.1–0.9)
Monocytes: 9 %
Neutrophils Absolute: 5.4 10*3/uL (ref 1.4–7.0)
Neutrophils: 68 %
Platelets: 269 10*3/uL (ref 150–450)
RBC: 4.03 x10E6/uL (ref 3.77–5.28)
RDW: 13.1 % (ref 11.7–15.4)
RPR Ser Ql: NONREACTIVE
Rh Factor: POSITIVE
Rubella Antibodies, IGG: 1.59 index (ref >0.99)
WBC: 7.9 10*3/uL (ref 3.4–10.8)

## 2018-10-23 LAB — CYTOLOGY - PAP
Diagnosis: NEGATIVE
HPV: NOT DETECTED

## 2018-10-23 LAB — CULTURE, OB URINE

## 2018-10-23 LAB — URINE CULTURE, OB REFLEX

## 2018-10-24 ENCOUNTER — Encounter: Payer: Self-pay | Admitting: *Deleted

## 2018-10-29 ENCOUNTER — Encounter: Payer: Self-pay | Admitting: *Deleted

## 2018-11-03 ENCOUNTER — Encounter: Payer: Self-pay | Admitting: *Deleted

## 2018-12-08 ENCOUNTER — Other Ambulatory Visit: Payer: Self-pay

## 2018-12-08 ENCOUNTER — Other Ambulatory Visit (HOSPITAL_COMMUNITY): Payer: Self-pay | Admitting: *Deleted

## 2018-12-08 ENCOUNTER — Ambulatory Visit (HOSPITAL_COMMUNITY)
Admission: RE | Admit: 2018-12-08 | Discharge: 2018-12-08 | Disposition: A | Discharge status: Home / Self Care | Payer: Self-pay | Source: Ambulatory Visit | Attending: Obstetrics and Gynecology | Admitting: Obstetrics and Gynecology

## 2018-12-08 DIAGNOSIS — Z3689 Encounter for other specified antenatal screening: Secondary | ICD-10-CM

## 2018-12-08 DIAGNOSIS — Z3490 Encounter for supervision of normal pregnancy, unspecified, unspecified trimester: Secondary | ICD-10-CM | POA: Insufficient documentation

## 2018-12-08 DIAGNOSIS — O209 Hemorrhage in early pregnancy, unspecified: Secondary | ICD-10-CM | POA: Insufficient documentation

## 2018-12-08 DIAGNOSIS — Z363 Encounter for antenatal screening for malformations: Secondary | ICD-10-CM

## 2018-12-08 DIAGNOSIS — Z3A19 19 weeks gestation of pregnancy: Secondary | ICD-10-CM

## 2018-12-10 LAB — AFP, SERUM, OPEN SPINA BIFIDA
AFP MoM: 1.1
AFP Value: 49.7 ng/mL
Gest. Age on Collection Date: 19 weeks
Maternal Age At EDD: 34.9 yr
OSBR Risk 1 IN: 8933
Test Results:: NEGATIVE
Weight: 167 [lb_av]

## 2018-12-16 ENCOUNTER — Telehealth: Payer: Self-pay | Admitting: Family Medicine

## 2018-12-16 ENCOUNTER — Telehealth: Payer: Self-pay | Admitting: Student

## 2018-12-16 NOTE — Telephone Encounter (Signed)
Spanish interpreter Eda attempted to contact patient about her appointment on 9/23 @ 9:15. No answer, and Eda was unable to leave a message.

## 2018-12-16 NOTE — Telephone Encounter (Signed)
Called the patient to inform of the cancellation of the appointment due to changes in the providers schedule. You will be contacted as early as tomorrow with an appointment update. If you have any question or concerns, please contact our office.   Interrupter: [id number]

## 2018-12-17 ENCOUNTER — Encounter: Payer: Self-pay | Admitting: Family Medicine

## 2018-12-31 ENCOUNTER — Ambulatory Visit (INDEPENDENT_AMBULATORY_CARE_PROVIDER_SITE_OTHER): Payer: Self-pay | Admitting: Medical

## 2018-12-31 ENCOUNTER — Other Ambulatory Visit: Payer: Self-pay

## 2018-12-31 ENCOUNTER — Encounter: Payer: Self-pay | Admitting: Medical

## 2018-12-31 DIAGNOSIS — Z3A22 22 weeks gestation of pregnancy: Secondary | ICD-10-CM

## 2018-12-31 DIAGNOSIS — Z3492 Encounter for supervision of normal pregnancy, unspecified, second trimester: Secondary | ICD-10-CM

## 2018-12-31 NOTE — Progress Notes (Signed)
3:21p-Called pt using Pray id# [phone] for [employer], rcvd message stating VM box not setup. Will retry in 15 to 20 minutes.  4:12p- I connected with  Deanna Obrien on 12/31/18 at  3:35 PM EDT by telephone and verified that I am speaking with the correct person using two identifiers.   I discussed the limitations, risks, security and privacy concerns of performing an evaluation and management service by telephone and the availability of in person appointments. I also discussed with the patient that there may be a patient responsible charge related to this service. The patient expressed understanding and agreed to proceed.  [name], [place] 12/31/2018  4:14 PM

## 2018-12-31 NOTE — Progress Notes (Signed)
I connected with Deanna Obrien on 12/31/18 at  3:35 PM EDT by: WebEx and verified that I am speaking with the correct person using two identifiers.  Patient is located at home and provider is located at [facility].     The purpose of this virtual visit is to provide medical care while limiting exposure to the novel coronavirus. I discussed the limitations, risks, security and privacy concerns of performing an evaluation and management service by WebEx and the availability of in person appointments. I also discussed with the patient that there may be a patient responsible charge related to this service. By engaging in this virtual visit, you consent to the provision of healthcare.  Additionally, you authorize for your insurance to be billed for the services provided during this visit.  The patient expressed understanding and agreed to proceed.  The following staff members participated in the virtual visit:  Carver Fila, [place] VISIT NOTE  Subjective:  Deanna Obrien is a 33 y.o. G3P2002 at [redacted]w[redacted]d  for phone visit for ongoing prenatal care.  She is currently monitored for the following issues for this low-risk pregnancy and has Supervision of low-risk pregnancy and Vaginal bleeding in pregnancy, first trimester on their problem list.  Patient reports no complaints.  Contractions: Not present. Vag. Bleeding: None.  Movement: Present. Denies leaking of fluid.   The following portions of the patient's history were reviewed and updated as appropriate: allergies, current medications, past family history, past medical history, past social history, past surgical history and problem list.   Objective:  There were no vitals filed for this visit. Self-Obtained  Fetal Status:     Movement: Present     Assessment and Plan:  Pregnancy: G3P2002 at [redacted]w[redacted]d 1. Encounter for supervision of low-risk pregnancy in second trimester - Doing well, no complaints  - Discussed negative AFP - Discussed  follow-up US scheduled 01/05/19 - Patient would like IUD for birth control after this pregnancy  - Patient denies having received a blood pressure cuff, will provide at next visit   Preterm labor symptoms and general obstetric precautions including but not limited to vaginal bleeding, contractions, leaking of fluid and fetal movement were reviewed in detail with the patient.  Return in about 4 weeks (around 01/28/2019) for LOB, In-Person.  Future Appointments  Date Time Provider [place]  01/05/2019 11:15 AM [place] 4 [id number] MFC-US     Time spent on virtual visit: 10 minutes pacific interpreter used   Kerry Hough, PA-C

## 2018-12-31 NOTE — Patient Instructions (Addendum)
Segundo trimestre de embarazo Second Trimester of Pregnancy  El segundo trimestre va desde la semana14 hasta la 27 (desde el mes 4 hasta el 6). Este suele ser el momento en el que mejor se siente. En general, las nuseas matutinas han disminuido o han desaparecido completamente. Tendr ms energa y podr aumentarle el apetito. El beb en gestacin se desarrolla rpidamente. Hacia el final del sexto mes, el beb mide aproximadamente 9 pulgadas (23 cm) y pesa alrededor de 1 libras (700 g). Es probable que sienta al beb moverse entre las 18 y 20 semanas del embarazo. Siga estas indicaciones en su casa: Medicamentos  Tome los medicamentos de venta libre y los recetados solamente como se lo haya indicado el mdico. Algunos medicamentos son seguros para tomar durante el embarazo y otros no lo son.  Tome vitaminas prenatales que contengan por lo menos 600microgramos (?g) de cido flico.  Si tiene dificultad para mover el intestino (estreimiento), tome un medicamento para ablandar las heces (laxante) si su mdico se lo autoriza. Comida y bebida   Ingiera alimentos saludables de manera regular.  No coma carne cruda ni quesos sin cocinar.  Si obtiene poca cantidad de calcio de los alimentos que ingiere, consulte a su mdico sobre la posibilidad de tomar un suplemento diario de calcio.  Evite el consumo de alimentos ricos en grasas y azcares, como los alimentos fritos y los dulces.  Si tiene malestar estomacal (nuseas) o devuelve (vomita): ? Ingiera 4 o 5comidas pequeas por da en lugar de 3abundantes. ? Intente comer algunas galletitas saladas. ? Beba lquidos entre las comidas, en lugar de hacerlo durante estas.  Para evitar el estreimiento: ? Consuma alimentos ricos en fibra, como frutas y verduras frescas, cereales integrales y frijoles. ? Beba suficiente lquido para mantener el pis (orina) claro o de color amarillo plido. Actividad  Haga ejercicios solamente como se lo haya  indicado el mdico. Interrumpa la actividad fsica si comienza a tener calambres.  No haga ejercicio si hace demasiado calor, hay demasiada humedad o se encuentra en un lugar de mucha altura (altitud alta).  Evite levantar pesos excesivos.  Use zapatos con tacones bajos. Mantenga una buena postura al sentarse y pararse.  Puede continuar teniendo relaciones sexuales, a menos que el mdico le indique lo contrario. Alivio del dolor y del malestar  Use un sostn que le brinde buen soporte si sus mamas estn sensibles.  Dese baos de asiento con agua tibia para aliviar el dolor o las molestias causadas por las hemorroides. Use una crema para las hemorroides si el mdico la autoriza.  Descanse con las piernas elevadas si tiene calambres o dolor de cintura.  Si desarrolla venas hinchadas y abultadas (vrices) en las piernas: ? Use medias de compresin o medias de descanso como se lo haya indicado el mdico. ? Levante (eleve) los pies durante 15minutos, 3 o 4veces por da. ? Limite el consumo de sal en sus alimentos. Cuidado prenatal  Escriba sus preguntas. Llvelas cuando concurra a las visitas prenatales.  Concurra a todas las visitas prenatales como se lo haya indicado el mdico. Esto es importante. Seguridad  Colquese el cinturn de seguridad cuando conduzca.  Haga una lista de los nmeros de telfono de emergencia, que incluya los nmeros de telfono de familiares, amigos, el hospital, as como los departamentos de polica y bomberos. Instrucciones generales  Consulte a su mdico sobre los alimentos que debe comer o pdale que la ayude a encontrar a quien pueda aconsejarla si necesita ese servicio.    Consulte a su mdico acerca de dnde se dictan clases prenatales cerca de donde vive. Comience las clases antes del mes 6 de embarazo.  No se d baos de inmersin en agua caliente, baos turcos ni saunas.  No se haga duchas vaginales ni use tampones o toallas higinicas perfumadas.   No mantenga las piernas cruzadas durante [place].  Vaya al dentista si an no lo hizo. Use un cepillo de cerdas suaves para cepillarse los dientes. Psese el hilo dental suavemente.  No fume, no consuma hierbas ni beba alcohol. No tome frmacos que el mdico no haya autorizado.  No consuma ningn producto que contenga nicotina o tabaco, como cigarrillos y Administrator, Civil Service. Si necesita ayuda para dejar de fumar, consulte al mdico.  Evite el contacto con las bandejas sanitarias de los gatos y la tierra que estos animales usan. Estos elementos contienen bacterias que pueden causar defectos congnitos al beb y la posible prdida del beb (aborto espontneo) o la muerte fetal. Comunquese con un mdico si:  Tiene clicos leves o siente presin en la parte baja del vientre.  Tiene dolor al hacer pis (orinar).  Advierte un lquido con olor ftido que proviene de la vagina.  Tiene Programme researcher, broadcasting/film/video (nuseas), devuelve (vomita) o tiene deposiciones acuosas (diarrea).  Sufre un dolor persistente en el abdomen.  Siente mareos. Solicite ayuda de inmediato si:  Tiene fiebre.  Tiene una prdida de lquido por la vagina.  Tiene sangrado o pequeas prdidas vaginales.  Siente dolor intenso o clicos en el abdomen.  Sube o baja de peso rpidamente.  Tiene dificultades para recuperar el aliento y siente dolor en el pecho.  Sbitamente se le hinchan mucho el rostro, las [place], los tobillos, los pies o las piernas.  No ha sentido los movimientos del beb durante Georgianne Fick.  Siente un dolor de cabeza intenso que no se alivia al tomar [employer].  Tiene dificultad para ver. Resumen  El segundo trimestre va desde la semana14 hasta la 27, desde el mes 4 hasta el 6. Este suele ser el momento en el que mejor se siente.  Para cuidarse y cuidar a su beb en gestacin, debe comer alimentos saludables, tomar medicamentos solamente si su mdico le indica que lo haga y hacer  actividades que sean seguras para usted y su beb.  Llame al mdico si se enferma o si nota algo inusual acerca de su embarazo. Tambin llame al mdico si necesita ayuda para saber qu alimentos debe comer o si quiere saber qu actividades puede realizar de forma segura. Esta informacin no tiene Theme park manager el consejo del mdico. Asegrese de hacerle al mdico cualquier pregunta que tenga. Document Released: 11/12/2012 Document Revised: 12/05/2016 Document Reviewed: 12/05/2016 Elsevier Patient Education  2020 Elsevier Inc.  AREA PEDIATRIC/FAMILY PRACTICE PHYSICIANS  [place]outheast [place] ([address]) . [facility] Melodie Bouillon, MD; Lum Babe, MD; Sheffield Slider, MD; Leveda Anna, MD; McDiarmid, MD; Jerene Bears, MD; Jennette Kettle, MD; Gwendolyn Grant, MD o [address] [place]., [place], [place] [address] o [phone] o Mon-Fri 8:30-12:30, 1:30-5:00 o Providers come to see babies at [facility] o Accepting Medicaid . [facility] [place] o Limited providers who accept newborns: Docia Chuck, MD; Kateri Plummer, MD; Paulino Rily, MD o [address], [place], [place] [address] o [phone] o Mon-Fri 8:00-5:30 o Babies seen by providers at [facility] o Does NOT accept Medicaid o Please call early in hospitalization for appointment (limited availability)  . [place] [employer] o Delrae Alfred, MD o [address],  [place], [place] [address] o [phone] o Mon, Tue, Thur, Fri 8:30-5:00, Wed 10:00-7:00 (closed 1-2pm) o Babies seen by [facility] providers o Accepting Medicaid . [place], MD o [place], [place], [place] [address] o [phone] o Mon-Fri 8:30-5:00, Sat 8:30-12:00 o Provider comes to see babies at [place] Medicaid o Must have been referred from current patients or contacted office prior to delivery . [place] [facility] ([place] for  [place]) Franne Forts, MD; Tamera Punt, MD; Doneen Poisson, MD; Fatima Sanger, MD; Wynetta Emery, MD; Jess Barters, MD; Tami Ribas, MD; Herbert Moors, MD; Derrell Lolling, MD; Dorothyann Peng, MD; Lucious Groves, NP; Baldo Ash, NP o [place] [address], [place], [place] [address] o [phone] o Mon, Tue, Thur, Fri 8:30-5:30, Wed 9:30-5:30, Sat 8:30-12:30 o Babies seen by [facility] providers o Accepting Medicaid o Only accepting infants of first-time parents or siblings of current patients [facility] discharge coordinator will make follow-up appointment . Baltazar Najjar o [place] [address], [place], [place]  [address] o [phone]   Fax - [phone] . [facility] o [address], [place], [place]  [address] o Phone - [phone]   Fax [phone] . [place], [place], [place], [place]  [address] o [phone]  [place] [phone]) . [employer] [facility] Reginal Lutes, MD; Jacklynn Ganong, MD; Torrie Mayers, MD; MD; Rosana Hoes, MD; Servando [place], MD; Rose Fillers, MD; Rex Kras, MD; Corinna Capra, MD; Volney American, MD; Trilby Drummer, MD; Janann Colonel, MD; Jimmye Norman, [place], [place], [place] [address] o [phone] o Mon-Fri 8:30-5:00 (extended evenings Mon-Thur as needed), Sat-Sun 10:00-1:00 o Providers come to see babies at [place] Medicaid for families of first-time babies and families with all children in the household age 52 and under. Must register with office prior to making appointment (M-F only). . [place], NP; Tomi Bamberger, MD; Redmond School, MD; [place], [place] [place], [place] [address] o [phone] o Mon-Fri 8:00-5:00 o Babies seen by providers at [facility] o Does NOT accept Medicaid/Commercial Insurance Only . [facility] [place] ([facility])  Marnee Guarneri, MD; Drema Dallas, MD; Montine Circle, MD; Vilma Prader, MD; Vanita Panda, MD; Alfonso Ramus, MD; Ruthann Cancer, MD; Roxanne Mins, MD; Rosalva Ferron, MD; Polly Cobia, MD o [address]  [place], [place] [address] o [phone] o Mon-Fri 8:30-5:30, Sat (Oct.-Mar.) 9:00-1:00 o Babies seen by providers at [place] [phone]) . [facility] Peers, MD; Suzan Slick, MD o [place] [address], [place], [place] [address] o [phone] o Mon-Fri 8:30-5:00, Sat 8:30-12:00 o Providers come to see babies at [facility] o Does NOT accept Medicaid . [place], [place]; Rico, MD; [place], [place]; Nancy Fetter, MD; Moreen Fowler, [place], [place], [place] [address] o [phone] o Mon-Fri 8:00-5:00 o Babies seen by providers at [facility] o Does NOT accept Medicaid o Only accepting babies of parents who are patients o Please call early in hospitalization for appointment (limited availability) . [facility] Blanca Friend, MD; Sharlene Motts, MD; Rod Can, MD; Warner Mccreedy, NP; Sabra Heck, MD; Ermalinda Memos, MD; Sharlett Iles, NP; Aurther Loft, MD; Jerrye Beavers, MD; Marcello Moores, MD; Berline Lopes, MD; Charolette Forward, MD o [place]. [place], [place],  [address] o [phone] o Mon-Fri 8:00-5:00, Sat 9:00-12:00 o Providers come to see babies at [facility] o Does NOT accept [facility] [phone]) . [place] [facility] o Limited providers accepting new patients: [place], NP; [place], [place] o [address], [place], [place] [address] o [phone] o Mon-Fri 8:00-5:00 o Babies seen by providers at [facility] o Does NOT accept Medicaid o Only accepting babies of parents who are patients o Please call early in hospitalization for appointment (limited availability) . [facility] Luan Pulling, MD; Nash Dimmer, MD o [address] [place]., [place], [place] [address] o [phone] (press 1 to schedule appointment) o Mon-Fri 8:00-5:00 o Providers come to see babies at [facility] o Does NOT accept Medicaid . [facility] Cristino Martes, MD o [address] [place], [place]  [address] o [phone] o Mon-Fri 8:30-5:00 (lunch 12:30-1:00), extended hours by appointment only Wed 5:00-6:30 o Babies seen by [facility] providers o Accepting Medicaid . [facility] Abbot, MD; [place], MD; Hassan Rowan, MD o [address] [place], [place], [place] [address] o [phone] o Mon-Fri 8:00-5:00 o Babies seen by [facility] providers o Does NOT accept Medicaid . Nature conservation officer at [place] [address] Elsworth Soho, MD; Durene Cal, MD; [place], DO o [address] [place], [place] [address] o [phone] o Mon-Fri 8:00-5:00 o Babies seen by [facility] providers o Does NOT accept Medicaid . [facility] o [place], [place]; [place], [place]; [place], NP; Avis Epley, MD; Vonna Kotyk, MD; Clance Boll, MD; Stevphen Rochester, NP; Arvilla Market, NP; Ann Maki, NP; Otis Dials, NP; Vaughan Basta, MD; [place], MD o [address] [place], [place] [address] o [phone] o Mon-Fri 8:30-5:00, Sat 10:00-1:00 o Providers come to see babies at [facility] o Does NOT accept Medicaid o Free prenatal information session Tuesdays at 4:45pm . [facility] Luna Kitchens, MD; [place], [place]; [place], [place]; [place], [place] o [address] [place] [address] o [phone] o Mon-Fri 7:30-5:30 o Babies seen by [facility] providers . [facility] o [address], [place], [place]  [address] o [phone]   Fax - [phone]  [employer] [phone] & [phone]) . [facility] Alphonsa Overall, MD o [address] [place], [place] [address] o [phone] o Mon-Thur 8:00-6:00 o Providers come to see babies at [facility] o Accepting Medicaid . [facility] Zenon Mayo, NP; Cyndia Bent, MD; [place], [place]; [place], [place] o [address] [place], [place] [address] o [phone] o Mon-Thur 7:30-7:30, Fri 7:30-4:30 o Babies seen by [facility] providers o Accepting Medicaid . [facility] Cheryle Horsfall, MD; Janene Harvey,  NP; Vonita Moss, MD o [address], [place], [place] [address] o [phone] o Mon-Fri 8:30-5:00, Sat 8:30-12:00 o Providers come to see babies at [facility] o Accepting Medicaid o Must have "Meet & Greet" appointment at office prior to delivery . [facility] [place] ([facility] [place]) Llana Aliment, MD; Earlene Plater, MD; Lucretia Roers, MD o [address], [place], [place] [address] o [phone] o Mon-Wed 8:00-6:00, Thur-Fri 8:00-5:00, Sat 9:00-12:00 o Providers come to see babies at [facility] o Does NOT accept Medicaid o Only accepting siblings of current patients . [facility]  o [address], [place], [place]  [address] o [phone]   Fax - [phone] . [facility] at [facility] o [phone] [address] [place], [place]  [address] o [phone]   Fax - [phone]  [place] [phone] & [phone]) . Nature conservation officer at [facility] o [place], DO; [place], DO o [address] [place], [place] [address] o [phone] o Mon-Fri 7:00-5:00 o Babies seen by  [facility] providers o Does NOT accept Medicaid . [facility] Ellis Savage, MD; [place], [place]; [place], [place] o [address], [place], [place] [address] o [phone] o Mon-Fri 8:00-5:00 o Babies seen by [facility] providers o Accepting Medicaid . [facility] - [address] Franne Forts, MD; [place], [place]; [place], NP; [place], [place] o [address] [place], [place], [place] [address] o [phone] o Mon-Fri 8:00-5:00 o Babies seen by providers at [facility] o Accepting [facility] [place] [phone]) . [facility] [place], [place] o [address] [place], [place] [address] o [phone] o Mon-Fri 8:00-5:00 o Babies seen by [facility] providers o Does NOT accept  Medicaid o Limited availability, please call early in hospitalization to schedule follow-up . [facility] Jolee Ewing, PA; Eddie Candle, MD; [place], MD; [place], [place]; Constance Goltz, MD; [place], [place] o [facility] [facility] [address], [place], [place] [address] o [phone] o Mon-Fri 8:30-5:00, Sat 9:00-12:00 o Babies seen by providers at [facility] o Accepting Medicaid o Please register online then schedule online or call office o [email or web address] . [facility]) Samuella Bruin, NP; Lucianne Muss, MD; Lanier Clam, PA o [address], [place], [place] [address] o [phone] o Mon-Fri 8:00-5:00 o Babies seen by providers at [facility] o Accepting Medicaid . [facility] ([facility] [employer] [facility]) Sharin Mons, MD; Reed Breech, NP; Shelva Majestic, MD o [address], [place], [place] [address] o [phone] o Mon-Fri 8:00-5:30, Sat&Sun by appointment (phones open at 8:30) o Babies seen by [facility] providers o Accepting Medicaid o Must be a first-time baby or sibling of current patient . [facility] [address], [place], [place]  [address] o [phone]   Fax - [phone]  [place] [phone] & [phone]) . [facility] o [place], [place]; [place], [place]; Dimple Casey, MD; [place], [place]; Carolyne Fiscal, MD o [address] [place], [place] [address] o [phone] o Mon-Thur 8:00-7:00, Fri 8:00-5:00, Sat 8:00-12:00, Sun 9:00-12:00 o Babies seen by [facility] providers o Accepting Medicaid . [facility] at [facility], MD; Gaynell Face, MD; [facility], MD o [address], [place], [place] [address] o [phone] o Mon-Thur 8:00-5:00 o Babies seen by providers at [facility] o Accepting Medicaid . [facility] at [place] Gwenlyn Saran, MD; [place], MD; Madilyn Fireman,  MD; Melvyn Neth, MD; List, MD; Lazarus Salines, MD; Gaynell Face, MD; Berneda Rose, MD; Flora Lipps, MD; Beryl Meager, MD; Luther Redo, MD; Lavonia Drafts, MD; Kellie Simmering, MD o [address] [place] [place], [place] [address] o [phone] o Mon-Fri 8:00-5:30, Sat (Oct.-Mar.) 9:00-1:00 o Babies seen by providers at [facility] o Accepting Medicaid o Must fill out new patient packet, available online at [email or web address] . [facility] [place] [facility] at [facility]) Simone Curia, NP; Tiburcio Pea, NP; Tresa Endo, NP; [place] Post, MD; [place], [place]; Hennie Duos, MD; Wynne Dust, MD; Kavin Leech, NP o [address], [place], [place] [address] o [phone] o Mon-Thur 8:00-5:30, Fri 8:00-5:00 o Babies seen by providers at [facility] o Accepting [facility] [phone]) . [facility] o [place], [place]; Barnard, MD; Tanya Nones, MD; [place], [place] o [address] [place], [place] [address] o [phone] o Mon-Fri 8:00-5:00 o Babies seen by providers  at [facility] o Accepting [facility]   [place] [phone]) . [facility] o HudsonMasneri, DO; Lenise ArenaMeyers, MD; [place], [place] o [address], [place], [place] [address] o [phone] o Mon-Fri 8:00-5:00 o Babies seen by providers at [facility] o Does NOT accept Medicaid o Limited appointment availability, please call early in hospitalization  . Nature conservation officerLeBauer HealthCare at [facility] o NewportKunedd, DO; KimMcGowen, MD o [address], [place], [place] [address] o [phone] o Mon-Fri 8:00-5:00 o Babies seen by [facility] providers o Does NOT accept Medicaid . [facility] Lorrine Kino Cameron, MD; Ninetta LightsMacDonald, MD; Mira Monte[place], [place]; BurtNayak, MD o [address] [facility] [address], [place], [place] [address] o [phone] o Mon-Fri 8:00-5:00 o After hours clinic [facility]([address] [place], [place] [address]) [phone] Mon-Fri 5:00-8:00, Sat 12:00-6:00, Sun 10:00-4:00 o Babies seen by [facility] providers o  Accepting Medicaid . [facility] o [address], [place], [place]  [address] o [phone]   Fax - [phone]  [place] [phone]) . Nature conservation officerLeBauer HealthCare at [facility] o Andy, MD o [address] [place], [place], [place] [address] o [phone] o Mon-Fri 8:00-5:00 o Babies seen by [facility] providers o Does NOT accept Medicaid . [facility] [place] [facility] [place]) Tomi Likenso Eksir, MD o [address], [place], [place] [address] o [phone] o Mon-Thur 8:00-7:00, Fri 8:00-5:00, Sat 8:00-12:00 o Babies seen by providers at [facility] o Accepting Medicaid - but does not have vaccinations in office (must be received elsewhere) o Limited availability, please call early in hospitalization  [place] ([phone]) . [facility]  o Wyvonne Lenzharlene Flemming, MD o [address], [place] [address] o [phone]  Fax [phone]

## 2019-01-05 ENCOUNTER — Ambulatory Visit (HOSPITAL_COMMUNITY)
Admission: RE | Admit: 2019-01-05 | Discharge: 2019-01-05 | Disposition: A | Discharge status: Home / Self Care | Payer: Self-pay | Source: Ambulatory Visit | Attending: Maternal & Fetal Medicine | Admitting: Maternal & Fetal Medicine

## 2019-01-05 ENCOUNTER — Other Ambulatory Visit: Payer: Self-pay

## 2019-01-05 ENCOUNTER — Other Ambulatory Visit (HOSPITAL_COMMUNITY): Payer: Self-pay | Admitting: *Deleted

## 2019-01-05 DIAGNOSIS — Z362 Encounter for other antenatal screening follow-up: Secondary | ICD-10-CM

## 2019-01-05 DIAGNOSIS — Z3A23 23 weeks gestation of pregnancy: Secondary | ICD-10-CM

## 2019-01-05 DIAGNOSIS — Z3689 Encounter for other specified antenatal screening: Secondary | ICD-10-CM | POA: Insufficient documentation

## 2019-01-25 DIAGNOSIS — O24419 Gestational diabetes mellitus in pregnancy, unspecified control: Secondary | ICD-10-CM

## 2019-01-25 HISTORY — DX: Gestational diabetes mellitus in pregnancy, unspecified control: O24.419

## 2019-01-28 ENCOUNTER — Other Ambulatory Visit: Payer: Self-pay

## 2019-01-28 ENCOUNTER — Ambulatory Visit (INDEPENDENT_AMBULATORY_CARE_PROVIDER_SITE_OTHER): Payer: Self-pay | Admitting: Advanced Practice Midwife

## 2019-01-28 VITALS — BP 114/65 | HR 78 | Temp 97.9°F | Wt 173.8 lb

## 2019-01-28 DIAGNOSIS — Z789 Other specified health status: Secondary | ICD-10-CM | POA: Insufficient documentation

## 2019-01-28 DIAGNOSIS — Z3A26 26 weeks gestation of pregnancy: Secondary | ICD-10-CM

## 2019-01-28 DIAGNOSIS — Z3492 Encounter for supervision of normal pregnancy, unspecified, second trimester: Secondary | ICD-10-CM

## 2019-01-28 DIAGNOSIS — Z23 Encounter for immunization: Secondary | ICD-10-CM

## 2019-01-28 NOTE — Progress Notes (Signed)
   PRENATAL VISIT NOTE  Subjective:  Deanna Obrien is a 33 y.o. G3P2002 at [redacted]w[redacted]d being seen today for ongoing prenatal care.  She is currently monitored for the following issues for this low-risk pregnancy and has Supervision of low-risk pregnancy; Vaginal bleeding in pregnancy, first trimester; and Language barrier affecting health care on their problem list.  Patient reports no complaints.  Contractions: Not present. Vag. Bleeding: None.  Movement: Present. Denies leaking of fluid.   The following portions of the patient's history were reviewed and updated as appropriate: allergies, current medications, past family history, past medical history, past social history, past surgical history and problem list. Problem list updated.  Objective:   Vitals:   01/28/19 1507  BP: 114/65  Pulse: 78  Temp: 97.9 F (36.6 C)  Weight: 173 lb 12.8 oz (78.8 kg)    Fetal Status: Fetal Heart Rate (bpm): 152 Fundal Height: 27 cm Movement: Present     General:  Alert, oriented and cooperative. Patient is in no acute distress.  Skin: Skin is warm and dry. No rash noted.   Cardiovascular: Normal heart rate noted  Respiratory: Normal respiratory effort, no problems with respiration noted  Abdomen: Soft, gravid, appropriate for gestational age.  Pain/Pressure: Absent     Pelvic: Cervical exam deferred        Extremities: Normal range of motion.  Edema: None  Mental Status: Normal mood and affect. Normal behavior. Normal judgment and thought content.   Assessment and Plan:  Pregnancy: G3P2002 at [redacted]w[redacted]d  1. Encounter for supervision of low-risk pregnancy in second trimester - No complaints or concerns, continue routine care - Completion of fetal anatomy 02/02/19 (spine) - Given BP cuff today. May be eligible for more virtual appointments in third trimester. Reassured she can continue in-person if preferred  2. Language barrier affecting health care - Interpreter Eda present for all patient  interaction  Preterm labor symptoms and general obstetric precautions including but not limited to vaginal bleeding, contractions, leaking of fluid and fetal movement were reviewed in detail with the patient. Please refer to After Visit Summary for other counseling recommendations.  Return in about 2 weeks (around 02/11/2019) for In person, 28 week GTT.  Future Appointments  Date Time Provider [place]  02/02/2019  8:45 AM Kechi Martin MFC-US  02/02/2019  8:45 AM [place] 2 WH-MFCUS MFC-US  02/10/2019 10:00 AM WOC-WOCA LAB WOC-WOCA WOC  02/11/2019  8:35 AM Hillard Danker, Myles Rosenthal, PA-C WOC-WOCA WOC    Darlina Rumpf, CNM

## 2019-01-28 NOTE — Patient Instructions (Signed)
Tercer trimestre de embarazo Third Trimester of Pregnancy  El tercer trimestre comprende desde la semana28 hasta la semana40 (desde el mes7 hasta el mes9). En este trimestre, el beb en gestacin (feto) crece muy rpidamente. Hacia el final del noveno mes, el beb en gestacin mide alrededor de 20pulgadas (45cm) de largo. Pesa entre 6y 10libras (2,70y 4,50kg). Siga estas indicaciones en su casa: Medicamentos  Tome los medicamentos de venta libre y los recetados solamente como se lo haya indicado el mdico. Algunos medicamentos son seguros para tomar durante el embarazo y otros no lo son.  Tome vitaminas prenatales que contengan por lo menos 600microgramos (?g) de cido flico.  Si tiene dificultad para mover el intestino (estreimiento), tome un medicamento para ablandar las heces (laxante) si su mdico se lo autoriza. Comida y bebida   Ingiera alimentos saludables de manera regular.  No coma carne cruda ni quesos sin cocinar.  Si obtiene poca cantidad de calcio de los alimentos que ingiere, consulte a su mdico sobre la posibilidad de tomar un suplemento diario de calcio.  La ingesta diaria de cuatro o cinco comidas pequeas en lugar de tres comidas abundantes.  Evite el consumo de alimentos ricos en grasas y azcares, como los alimentos fritos y los dulces.  Para evitar el estreimiento: ? Consuma alimentos ricos en fibra, como frutas y verduras frescas, cereales integrales y frijoles. ? Beba suficiente lquido para mantener el pis (orina) claro o de color amarillo plido. Actividad  Haga ejercicios solamente como se lo haya indicado el mdico. Interrumpa la actividad fsica si comienza a tener calambres.  No levante objetos pesados, use zapatos de tacones bajos y sintese derecha.  No haga ejercicio si hace demasiado calor, hay demasiada humedad o se encuentra en un lugar de mucha altura (altitud alta).  Puede continuar teniendo relaciones sexuales, a menos que el  mdico le indique lo contrario. Alivio del dolor y del malestar  Use un sostn que le brinde buen soporte si sus mamas estn sensibles.  Haga pausas frecuentes y descanse con las piernas levantadas si tiene calambres en las piernas o dolor en la zona lumbar.  Dese baos de asiento con agua tibia para aliviar el dolor o las molestias causadas por las hemorroides. Use una crema para las hemorroides si el mdico la autoriza.  Si desarrolla venas hinchadas y abultadas (vrices) en las piernas: ? Use medias de compresin o medias de descanso como se lo haya indicado el mdico. ? Levante (eleve) los pies durante 15minutos, 3 o 4veces por da. ? Limite el consumo de sal en sus alimentos. Seguridad  Colquese el cinturn de seguridad cuando conduzca.  Haga una lista de los nmeros de telfono de emergencia, que incluya los nmeros de telfono de familiares, amigos, el hospital, as como los departamentos de polica y bomberos. Preparacin para la llegada del beb Para prepararse para la llegada de su beb:  Tome clases prenatales.  Practique ir manejando al hospital.  Visite el hospital y recorra el rea de maternidad.  Hable en su trabajo acerca de tomar licencia cuando llegue el beb.  Prepare el bolso que llevar al hospital.  Prepare la habitacin del beb.  Concurra a los controles mdicos.  Compre un asiento de seguridad orientado hacia atrs para llevar al beb en el automvil. Aprenda cmo instalarlo en el auto. Instrucciones generales  No se d baos de inmersin en agua caliente, baos turcos ni saunas.  No consuma ningn producto que contenga nicotina o tabaco, como cigarrillos y cigarrillos   electrnicos. Si necesita ayuda para dejar de fumar, consulte al mdico.  No beba alcohol.  No se haga duchas vaginales ni use tampones o toallas higinicas perfumadas.  No mantenga las piernas cruzadas durante mucho tiempo.  No haga viajes de larga distancia, excepto si es  obligatorio. Hgalos solamente si su mdico la autoriza.  Visite a su dentista si no lo ha hecho durante el embarazo. Use un cepillo de cerdas suaves para cepillarse los dientes. Psese el hilo dental con suavidad.  Evite el contacto con las bandejas sanitarias de los gatos y la tierra que estos animales usan. Estos elementos contienen bacterias que pueden causar defectos congnitos al beb y la posible prdida del beb (aborto espontneo) o la muerte fetal.  Concurra a todas las visitas prenatales como se lo haya indicado el mdico. Esto es importante. Comunquese con un mdico si:  No est segura de si est en trabajo de parto o si ha roto la bolsa de las aguas.  Tiene mareos.  Tiene clicos leves o siente presin en la parte baja del vientre.  Sufre un dolor persistente en el abdomen.  Sigue teniendo malestar estomacal, vomita o tiene heces lquidas.  Advierte un lquido con olor ftido que proviene de la vagina.  Siente dolor al orinar. Solicite ayuda de inmediato si:  Tiene fiebre.  Tiene una prdida de lquido por la vagina.  Tiene sangrado o pequeas prdidas vaginales.  Siente dolor intenso o clicos en el abdomen.  Aumenta o baja de peso rpidamente.  Tiene dificultades para recuperar el aliento y siente dolor en el pecho.  Sbitamente se le hinchan mucho el rostro, las manos, los tobillos, los pies o las piernas.  No ha sentido los movimientos del beb durante una hora.  Siente un dolor de cabeza intenso que no se alivia con medicamentos.  Tiene dificultad para ver.  Tiene prdida de lquido o le sale un chorro de lquido de la vagina antes de estar en la semana 37.  Tiene espasmos abdominales (contracciones) regulares antes de estar en la semana 37. Resumen  El tercer trimestre comprende desde la semana28 hasta la semana40 (desde el mes7 hasta el mes9). Esta es la poca en que el beb en gestacin crece muy rpidamente.  Siga los consejos del mdico  con respecto a los medicamentos, la alimentacin y la actividad.  Preprese para la llegada del beb tomando las clases prenatales, preparando todo lo que necesitar el beb, arreglando la habitacin del beb y concurriendo a los controles mdicos.  Solicite ayuda de inmediato si tiene sangrado por la vagina, siente dolor en el pecho o tiene dificultad para respirar, o si no ha sentido que su beb se mueve en el transcurso de ms de una hora. Esta informacin no tiene como fin reemplazar el consejo del mdico. Asegrese de hacerle al mdico cualquier pregunta que tenga. Document Released: 11/12/2012 Document Revised: 10/15/2016 Document Reviewed: 10/15/2016 Elsevier Patient Education  2020 Elsevier Inc.  

## 2019-02-02 ENCOUNTER — Ambulatory Visit (HOSPITAL_COMMUNITY): Payer: Self-pay | Admitting: *Deleted

## 2019-02-02 ENCOUNTER — Encounter (HOSPITAL_COMMUNITY): Payer: Self-pay

## 2019-02-02 ENCOUNTER — Ambulatory Visit (HOSPITAL_COMMUNITY)
Admission: RE | Admit: 2019-02-02 | Discharge: 2019-02-02 | Disposition: A | Discharge status: Home / Self Care | Payer: Self-pay | Source: Ambulatory Visit | Attending: Obstetrics and Gynecology | Admitting: Obstetrics and Gynecology

## 2019-02-02 ENCOUNTER — Other Ambulatory Visit: Payer: Self-pay

## 2019-02-02 VITALS — BP 111/64 | HR 79 | Temp 98.4°F

## 2019-02-02 DIAGNOSIS — Z3A27 27 weeks gestation of pregnancy: Secondary | ICD-10-CM

## 2019-02-02 DIAGNOSIS — Z362 Encounter for other antenatal screening follow-up: Secondary | ICD-10-CM | POA: Insufficient documentation

## 2019-02-02 DIAGNOSIS — O209 Hemorrhage in early pregnancy, unspecified: Secondary | ICD-10-CM | POA: Insufficient documentation

## 2019-02-02 DIAGNOSIS — O0990 Supervision of high risk pregnancy, unspecified, unspecified trimester: Secondary | ICD-10-CM

## 2019-02-10 ENCOUNTER — Other Ambulatory Visit: Payer: Self-pay

## 2019-02-10 ENCOUNTER — Telehealth: Payer: Self-pay | Admitting: Obstetrics and Gynecology

## 2019-02-10 ENCOUNTER — Other Ambulatory Visit: Payer: Self-pay | Admitting: *Deleted

## 2019-02-10 DIAGNOSIS — Z3490 Encounter for supervision of normal pregnancy, unspecified, unspecified trimester: Secondary | ICD-10-CM

## 2019-02-10 NOTE — Telephone Encounter (Signed)
Spanish interpreter Eda attempted to contact patient about her appointment on 11/18 @ 8:20. No answer, and Eda was unable to leave a message.

## 2019-02-11 ENCOUNTER — Encounter: Payer: Self-pay | Admitting: Medical

## 2019-02-12 ENCOUNTER — Other Ambulatory Visit: Payer: Self-pay

## 2019-02-12 ENCOUNTER — Ambulatory Visit (INDEPENDENT_AMBULATORY_CARE_PROVIDER_SITE_OTHER): Payer: Self-pay | Admitting: Obstetrics and Gynecology

## 2019-02-12 VITALS — BP 113/76 | HR 81 | Wt 175.0 lb

## 2019-02-12 DIAGNOSIS — Z3490 Encounter for supervision of normal pregnancy, unspecified, unspecified trimester: Secondary | ICD-10-CM

## 2019-02-12 DIAGNOSIS — Z3493 Encounter for supervision of normal pregnancy, unspecified, third trimester: Secondary | ICD-10-CM

## 2019-02-12 DIAGNOSIS — Z3A28 28 weeks gestation of pregnancy: Secondary | ICD-10-CM

## 2019-02-12 DIAGNOSIS — Z23 Encounter for immunization: Secondary | ICD-10-CM

## 2019-02-12 NOTE — Progress Notes (Signed)
   PRENATAL VISIT NOTE  Subjective:  Deanna Obrien is a 33 y.o. G3P2002 at [redacted]w[redacted]d being seen today for ongoing prenatal care.  She is currently monitored for the following issues for this low-risk pregnancy and has Supervision of low-risk pregnancy; Vaginal bleeding in pregnancy, first trimester; and Language barrier affecting health care on their problem list.  Patient reports no complaints.  Contractions: Not present. Vag. Bleeding: None.  Movement: Present. Denies leaking of fluid.   The following portions of the patient's history were reviewed and updated as appropriate: allergies, current medications, past family history, past medical history, past social history, past surgical history and problem list.   Objective:   Vitals:   02/12/19 0858  BP: 113/76  Pulse: 81  Weight: 175 lb (79.4 kg)    Fetal Status:   Fundal Height: 28 cm Movement: Present     General:  Alert, oriented and cooperative. Patient is in no acute distress.  Skin: Skin is warm and dry. No rash noted.   Cardiovascular: Normal heart rate noted  Respiratory: Normal respiratory effort, no problems with respiration noted  Abdomen: Soft, gravid, appropriate for gestational age.  Pain/Pressure: Absent     Pelvic: Cervical exam deferred        Extremities: Normal range of motion.  Edema: None  Mental Status: Normal mood and affect. Normal behavior. Normal judgment and thought content.   Assessment and Plan:  Pregnancy: G3P2002 at [redacted]w[redacted]d 1. Encounter for supervision of low-risk pregnancy, antepartum  - 2 hour GTT today.  - Tdap vaccine greater than or equal to 7yo IM  Preterm labor symptoms and general obstetric precautions including but not limited to vaginal bleeding, contractions, leaking of fluid and fetal movement were reviewed in detail with the patient. Please refer to After Visit Summary for other counseling recommendations.   Return in about 2 weeks (around 02/26/2019) for Virtual visit .  No  future appointments.  Noni Saupe, NP

## 2019-02-13 LAB — CBC
Hematocrit: 35.5 % (ref 34.0–46.6)
Hemoglobin: 12.1 g/dL (ref 11.1–15.9)
MCH: 30.9 pg (ref 26.6–33.0)
MCHC: 34.1 g/dL (ref 31.5–35.7)
MCV: 91 fL (ref 79–97)
Platelets: 289 10*3/uL (ref 150–450)
RBC: 3.91 x10E6/uL (ref 3.77–5.28)
RDW: 13 % (ref 11.7–15.4)
WBC: 7.1 10*3/uL (ref 3.4–10.8)

## 2019-02-13 LAB — GLUCOSE TOLERANCE, 2 HOURS W/ 1HR
Glucose, 1 hour: 145 mg/dL (ref 65–179)
Glucose, 2 hour: 119 mg/dL (ref 65–152)
Glucose, Fasting: 96 mg/dL — ABNORMAL HIGH (ref 65–91)

## 2019-02-13 LAB — HIV ANTIBODY (ROUTINE TESTING W REFLEX): HIV Screen 4th Generation wRfx: NONREACTIVE

## 2019-02-13 LAB — RPR: RPR Ser Ql: NONREACTIVE

## 2019-02-15 ENCOUNTER — Encounter: Payer: Self-pay | Admitting: Obstetrics and Gynecology

## 2019-02-15 DIAGNOSIS — O24419 Gestational diabetes mellitus in pregnancy, unspecified control: Secondary | ICD-10-CM | POA: Insufficient documentation

## 2019-02-16 ENCOUNTER — Telehealth: Payer: Self-pay

## 2019-02-16 DIAGNOSIS — O24419 Gestational diabetes mellitus in pregnancy, unspecified control: Secondary | ICD-10-CM

## 2019-02-16 NOTE — Telephone Encounter (Addendum)
-----   Message from Lezlie Lye, NP sent at 02/15/2019  4:14 PM EST ----- Your glucose test is abnormal. You will need to meet with our diabetes coordinator. The office will schedule this. Thank you.  Attempted to contact pt with [facility] Interpreter # [phone] and was unable to LM due to VM box not set up yet.

## 2019-02-17 NOTE — Telephone Encounter (Signed)
Called pt with Spanish Interpreter Eda R., and unable to LM due to VM box not set up.  Letter sent.    Mel Almond, RN

## 2019-02-27 ENCOUNTER — Ambulatory Visit (INDEPENDENT_AMBULATORY_CARE_PROVIDER_SITE_OTHER): Payer: Self-pay | Admitting: Obstetrics and Gynecology

## 2019-02-27 ENCOUNTER — Other Ambulatory Visit: Payer: Self-pay

## 2019-02-27 ENCOUNTER — Encounter: Payer: Self-pay | Admitting: Obstetrics and Gynecology

## 2019-02-27 VITALS — BP 109/72 | HR 89

## 2019-02-27 DIAGNOSIS — O24419 Gestational diabetes mellitus in pregnancy, unspecified control: Secondary | ICD-10-CM

## 2019-02-27 DIAGNOSIS — Z3A30 30 weeks gestation of pregnancy: Secondary | ICD-10-CM

## 2019-02-27 DIAGNOSIS — Z789 Other specified health status: Secondary | ICD-10-CM

## 2019-02-27 DIAGNOSIS — Z3493 Encounter for supervision of normal pregnancy, unspecified, third trimester: Secondary | ICD-10-CM

## 2019-02-27 NOTE — Progress Notes (Signed)
I connected with  Deanna Obrien on 02/27/19 at  9:15 AM EST by telephone and verified that I am speaking with the correct person using two identifiers.   I discussed the limitations, risks, security and privacy concerns of performing an evaluation and management service by telephone and the availability of in person appointments. I also discussed with the patient that there may be a patient responsible charge related to this service. The patient expressed understanding and agreed to proceed.  [name], CMA 02/27/2019  9:05 AM

## 2019-02-27 NOTE — Progress Notes (Signed)
TELEHEALTH OBSTETRICS PRENATAL VIRTUAL VIDEO VISIT ENCOUNTER NOTE  Provider location: [facility] [place]   I connected with Deanna Obrien on 02/27/19 at  9:15 AM EST by WebEx Video Encounter at home and verified that I am speaking with the correct person using two identifiers.   I discussed the limitations, risks, security and privacy concerns of performing an evaluation and management service virtually and the availability of in person appointments. I also discussed with the patient that there may be a patient responsible charge related to this service. The patient expressed understanding and agreed to proceed. Subjective:  Deanna Obrien is a 33 y.o. G3P2002 at [redacted]w[redacted]d being seen today for ongoing prenatal care.  She is currently monitored for the following issues for this high-risk pregnancy and has Supervision of low-risk pregnancy; Language barrier affecting health care; and GDM (gestational diabetes mellitus) on their problem list.  Patient reports no complaints.  Contractions: Not present. Vag. Bleeding: None.  Movement: Present. Denies any leaking of fluid.   The following portions of the patient's history were reviewed and updated as appropriate: allergies, current medications, past family history, past medical history, past social history, past surgical history and problem list.   Objective:   Vitals:   02/27/19 0908  BP: 109/72  Pulse: 89    Fetal Status:     Movement: Present     General:  Alert, oriented and cooperative. Patient is in no acute distress.  Respiratory: Normal respiratory effort, no problems with respiration noted  Mental Status: Normal mood and affect. Normal behavior. Normal judgment and thought content.  Rest of physical exam deferred due to type of encounter  Imaging: [place] Mfm Ob Follow Up  Result Date: 02/02/2019 ----------------------------------------------------------------------  OBSTETRICS REPORT                        (Signed Final 02/02/2019 09:55 am) ---------------------------------------------------------------------- Patient Info  ID #:       [id number]                          D.O.B.:  1985-09-11 (33 yrs)  Name:       Deanna Obrien [facility]            Visit Date: 02/02/2019 08:35 am ---------------------------------------------------------------------- Performed By  Performed By:     Tommie Raymond BS,       Ref. Address:     [address], [address]                                                             [address] [place],                                                             [place] [address]  Attending:        Noralee Space MD        [facility]  Referred By:      Marny Lowenstein                    PA ---------------------------------------------------------------------- Orders   #  Description                          Code         Ordered By   1  [place] MFM OB FOLLOW UP                  [phone]     [facility]  ----------------------------------------------------------------------   #  Order #                    Accession #                 Episode #   1  [id number]                  [id number]                  [id number]  ---------------------------------------------------------------------- Indications   Antenatal follow-up for nonvisualized fetal    Z36.2   anatomy   (Negative AFP, Horizon and CF, Low Risk   NIPS)   [redacted] weeks gestation of pregnancy                Z3A.27  ---------------------------------------------------------------------- Vital Signs  Weight (lb): 173                               Height:        5'1"  BMI:         32.68 ---------------------------------------------------------------------- Fetal Evaluation  Num Of Fetuses:         1  Fetal Heart Rate(bpm):  158  Cardiac Activity:       Observed  Presentation:           Cephalic  Placenta:               Anterior  P. Cord Insertion:      Previously  Visualized  Amniotic Fluid  AFI FV:      Within normal limits                              Largest Pocket(cm)                              4.42 ---------------------------------------------------------------------- Biometry  BPD:        68  mm     G. Age:  27w 3d         51  %    CI:        78.82   %    70 - 86  FL/HC:      19.0   %    18.6 - 20.4  HC:      242.2  mm     G. Age:  26w 2d          8  %    HC/AC:      1.04        1.05 - 1.21  AC:      232.2  mm     G. Age:  27w 4d         60  %    FL/BPD:     67.5   %    71 - 87  FL:       45.9  mm     G. Age:  25w 2d          3  %    FL/AC:      19.8   %    20 - 24  Est. FW:     961  gm      2 lb 2 oz     24  % ---------------------------------------------------------------------- OB History  Gravidity:    3         Term:   2        Prem:   0        SAB:   0  Ectopic:      0        Living:  2 ---------------------------------------------------------------------- Gestational Age  LMP:           27w 0d        Date:  07/28/18                 EDD:   05/04/19  U/S Today:     26w 5d                                        EDD:   05/06/19  Best:          27w 0d     Det. By:  LMP  (07/28/18)          EDD:   05/04/19 ---------------------------------------------------------------------- Anatomy  Cranium:               Appears normal         Aortic Arch:            Previously seen  Cavum:                 Appears normal         Ductal Arch:            Appears normal  Ventricles:            Appears normal         Diaphragm:              Appears normal  Choroid Plexus:        Previously seen        Stomach:                Appears normal, left  sided  Cerebellum:            Previously seen        Abdomen:                Appears normal  Posterior Fossa:       Previously seen        Abdominal Wall:         Previously seen  Nuchal Fold:           Previously seen         Cord Vessels:           Appears normal (3                                                                        vessel cord)  Face:                  Orbits and profile     Kidneys:                Appear normal                         previously seen  Lips:                  Appears normal         Bladder:                Appears normal  Thoracic:              Appears normal         Spine:                  Limited views                                                                        appear normal  Heart:                 Appears normal         Upper Extremities:      Previously seen                         (4CH, axis, and                         situs)  RVOT:                  Appears normal         Lower Extremities:      Previously seen  LVOT:                  Appears normal ---------------------------------------------------------------------- Cervix Uterus Adnexa  Cervix  Not visualized (advanced GA >24wks)  Uterus  No abnormality visualized.  Left Ovary  Within normal limits. No adnexal mass visualized.  Right Ovary  Within normal  limits. No adnexal mass visualized.  Cul De Sac  No free fluid seen. ---------------------------------------------------------------------- Impression  Patient returned for completion of fetal anatomy.  Amniotic fluid is normal and good fetal activity is seen. Fetal  growth is appropriate for gestational age. Fetal anatomical  survey was completed and appears normal. ---------------------------------------------------------------------- Recommendations  Follow-up scans as clinically indicated. ----------------------------------------------------------------------                  Noralee Spaceavi Shankar, MD Electronically Signed Final Report   02/02/2019 09:55 am ----------------------------------------------------------------------   Assessment and Plan:  Pregnancy: [id number] at 7823w4d 1. Encounter for supervision of low-risk pregnancy in third trimester Sat[place] - Ambulatory  referral to Nutrition and Diabetic Education  2. Gestational diabetes mellitus (GDM), antepartum, gestational diabetes method of control unspecified GDM reviewed with pt. Has not seen DM educator yet Referral mde - Ambulatory referral to Nutrition and Diabetic Education  3. Language barrier affecting health care Live interrupter used during today's visit  Preterm labor symptoms and general obstetric precautions including but not limited to vaginal bleeding, contractions, leaking of fluid and fetal movement were reviewed in detail with the patient. I discussed the assessment and treatment plan with the patient. The patient was provided an opportunity to ask questions and all were answered. The patient agreed with the plan and demonstrated an understanding of the instructions. The patient was advised to call back or seek an in-person office evaluation/[facility] for any urgent or concerning symptoms. Please refer to After Visit Summary for other counseling recommendations.   I provided 8 minutes of face-to-face time during this encounter.  Return in about 2 weeks (around 03/13/2019) for OB visit, face to face MD provider.  No future appointments.  Hermina StaggersMichael L Rey Fors, MD [facility]

## 2019-03-12 ENCOUNTER — Other Ambulatory Visit: Payer: Self-pay

## 2019-03-12 ENCOUNTER — Ambulatory Visit (INDEPENDENT_AMBULATORY_CARE_PROVIDER_SITE_OTHER): Payer: Self-pay | Admitting: Obstetrics and Gynecology

## 2019-03-12 ENCOUNTER — Encounter: Payer: Self-pay | Attending: Obstetrics and Gynecology | Admitting: *Deleted

## 2019-03-12 ENCOUNTER — Ambulatory Visit: Payer: Self-pay | Admitting: *Deleted

## 2019-03-12 ENCOUNTER — Encounter: Payer: Self-pay | Admitting: Obstetrics and Gynecology

## 2019-03-12 VITALS — BP 114/53 | HR 78 | Wt 179.0 lb

## 2019-03-12 DIAGNOSIS — Z3A32 32 weeks gestation of pregnancy: Secondary | ICD-10-CM

## 2019-03-12 DIAGNOSIS — O24419 Gestational diabetes mellitus in pregnancy, unspecified control: Secondary | ICD-10-CM | POA: Insufficient documentation

## 2019-03-12 DIAGNOSIS — Z3493 Encounter for supervision of normal pregnancy, unspecified, third trimester: Secondary | ICD-10-CM

## 2019-03-12 DIAGNOSIS — Z713 Dietary counseling and surveillance: Secondary | ICD-10-CM | POA: Insufficient documentation

## 2019-03-12 DIAGNOSIS — Z789 Other specified health status: Secondary | ICD-10-CM

## 2019-03-12 NOTE — Patient Instructions (Signed)
Diabetes mellitus gestacional, diagnstico Gestational Diabetes Mellitus, Diagnosis La diabetes gestacional (diabetes mellitus gestacional) es una forma de diabetes a corto plazo (temporal) que puede presentarse durante Water quality scientist. Este cuadro desaparece despus del parto. Puede deberse a uno de [employer] o a ambos:  El pncreas no produce suficiente cantidad de una hormona llamada insulina.  El cuerpo no responde de forma normal a la insulina que produce. La insulina permite que los ciertos azcares (glucosa) ingresen a las clulas del cuerpo. Esto le proporciona energa. Si tiene diabetes, los azcares no pueden ingresar a las clulas. Esto produce un aumento del nivel de Dispensing optician (hiperglucemia). Si tiene diabetes gestacional:  Es ms probable que vuelva a tenerla si queda embarazada nuevamente.  Es ms probable que desarrolle diabetes tipo2 en el futuro. Si la diabetes gestacional se trata, es posible que ni usted ni el beb se vean afectados. El mdico fijar los objetivos del tratamiento para usted. En general, los resultados de los niveles de azcar en la sangre deben ser los siguientes:  Despus de no comer durante mucho tiempo (ayunar): 95mg /dl (5,71mmol/l).  Despus de las comidas (posprandial): ? Una hora despus de una comida: igual o menor que 140mg /dl (7,47mmol/l). ? Dos horas despus de una comida: igual o menor que 120mg /dl (6,66mmol/l).  Nivel de A1c (hemoglobinaA1c): del 6% al 6,5%. Siga estas indicaciones en su casa: Preguntas para hacerle al mdico   Puede hacer las siguientes preguntas: ? Es necesario que consulte a Radio broadcast assistant en el cuidado de la diabetes? ? Qu equipos necesitar para cuidarme en casa? ? Qu medicamentos necesito? Cundo debo tomarlos? ? Con qu frecuencia debo controlar mi nivel de azcar en la sangre? ? A qu nmero puedo llamar si tengo preguntas? ? Cundo es mi prxima cita con el mdico? Instrucciones  generales  [employer] de venta libre y los recetados solamente como se lo haya indicado el mdico.  Mantenga un peso saludable durante el [place].  Concurra a todas las visitas de seguimiento como se lo haya indicado el mdico. Esto es importante. Comunquese con un mdico si:  Su nivel de azcar en la sangre es igual o mayor que 240mg /dl (13,9mmol/dl).  Su nivel de azcar en la sangre es igual o mayor que 200mg /dl (11,28mmol/l), y tiene cetonas en la orina.  Ha estado enferma o ha tenido fiebre durante ms de 2 das y no [place].  Tiene alguno de estos problemas durante ms de 6horas: ? No puede comer ni beber. ? Siente malestar estomacal (nuseas). ? Vomita. ? Presenta heces lquidas (diarrea). Solicite ayuda de inmediato si:  El nivel de azcar en la sangre est por debajo de 54mg /dl (53mmol/l).  Se siente confundida.  Tiene dificultad para hacer lo siguiente: ? Pensar con claridad. ? Respirar.  El beb se mueve menos de lo normal.  Tiene alguno de estos sntomas: ? Niveles moderados o altos de cetonas en la orina. ? Sangre proveniente de la vagina. ? Secrecin de un lquido fuera de lo comn de la vagina. ? Contracciones prematuras. Estas pueden causar una sensacin de opresin en el vientre. Resumen  La diabetes gestacional es una forma de diabetes a corto plazo. Puede suceder mientras est embarazada. Este cuadro desaparece despus del parto.  Si la diabetes gestacional se trata, es posible que ni usted ni el beb se vean afectados. El mdico fijar los objetivos del tratamiento para usted.  Concurra a todas las visitas de seguimiento como se lo haya indicado el mdico.  Esto es importante. Esta informacin no tiene Marine scientist el consejo del mdico. Asegrese de hacerle al mdico cualquier pregunta que tenga. Document Released: 07/04/2015 Document Revised: 01/08/2017 Document Reviewed: 04/15/2015 Elsevier Patient Education  2020 [employer].

## 2019-03-12 NOTE — Progress Notes (Signed)
  Patient was seen on 03/12/2019 for Gestational Diabetes self-management. EDD 05/04/2019. Patient speaks Spanish, we had live interpretor here for this visit Apolonio Schneiders) Patient states no history of GDM. Diet history obtained. Patient eats fair variety of all food groups with limited vegetables but frequent fruits. Beverages include water, juice and infrequently regular soda (less than once a week).  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, hours vary.The following learning objectives were met by the patient :   States the definition of Gestational Diabetes  States why dietary management is important in controlling blood glucose  Describes the effects of carbohydrates on blood glucose levels  Demonstrates ability to create a balanced meal plan  Demonstrates carbohydrate counting   States when to check blood glucose levels  Demonstrates proper blood glucose monitoring techniques  States the effect of stress and exercise on blood glucose levels  States the importance of limiting caffeine and abstaining from alcohol and smoking  Plan:  Aim for 3 Carb Choices per meal (45 grams) +/- 1 either way  Aim for 1-2 Carbs per snack Begin reading food labels for Total Carbohydrate of foods If OK with your MD, consider  increasing your activity level by walking, Arm Chair Exercises or other activity daily as tolerated Begin checking BG before breakfast and 2 hours after first bite of breakfast, lunch and dinner as directed by MD  Bring Log Book/Sheet and meter to every medical appointment OR use Baby Scripts (see below) Baby Scripts:  Patient not appropriate for Baby Scripts due to language barrier  Take medication if directed by MD  Blood glucose monitor given: [employer] Lot # [id number] Blood glucose reading: 121 mg/dl @ 1 hour after her breakfast  Patient instructed to test pre breakfast and 2 hours each meal as directed by MD  Patient instructed to monitor glucose levels: FBS: 60 - 95  mg/dl 2 hour: <120 mg/dl  Patient received the following handouts: Spanish  Nutrition Diabetes and Pregnancy  Carbohydrate Counting List  BG Log Sheet  Patient will be seen for follow-up as needed.

## 2019-03-12 NOTE — Progress Notes (Signed)
Subjective:  Deanna Obrien is a 33 y.o. G3P2002 at [redacted]w[redacted]d being seen today for ongoing prenatal care.  She is currently monitored for the following issues for this high-risk pregnancy and has Supervision of low-risk pregnancy; Language barrier affecting health care; and GDM (gestational diabetes mellitus) on their problem list.  Patient reports no complaints.  Contractions: Irritability. Vag. Bleeding: None.  Movement: Present. Denies leaking of fluid.   The following portions of the patient's history were reviewed and updated as appropriate: allergies, current medications, past family history, past medical history, past social history, past surgical history and problem list. Problem list updated.  Objective:   Vitals:   03/12/19 1016  BP: (!) 114/53  Pulse: 78  Weight: 179 lb (81.2 kg)    Fetal Status: Fetal Heart Rate (bpm): 156   Movement: Present     General:  Alert, oriented and cooperative. Patient is in no acute distress.  Skin: Skin is warm and dry. No rash noted.   Cardiovascular: Normal heart rate noted  Respiratory: Normal respiratory effort, no problems with respiration noted  Abdomen: Soft, gravid, appropriate for gestational age. Pain/Pressure: Absent     Pelvic:  Cervical exam deferred        Extremities: Normal range of motion.  Edema: None  Mental Status: Normal mood and affect. Normal behavior. Normal judgment and thought content.   Urinalysis:      Assessment and Plan:  Pregnancy: G3P2002 at [redacted]w[redacted]d  1. Encounter for supervision of low-risk pregnancy in third trimester Stable - [facility] AMB BABYSCRIPTS SCHEDULE OPTIMIZATION  2. Gestational diabetes mellitus (GDM) in third trimester, gestational diabetes method of control unspecified Saw DM educator today CBG monitoring reviewed - [facility] AMB BABYSCRIPTS SCHEDULE OPTIMIZATION  3. Language barrier affecting health care Live interrupter used today  Preterm labor symptoms and general obstetric precautions  including but not limited to vaginal bleeding, contractions, leaking of fluid and fetal movement were reviewed in detail with the patient. Please refer to After Visit Summary for other counseling recommendations.  Return in about 2 weeks (around 03/26/2019) for OB visit, virtual, MD provider .   Chancy Milroy, MD

## 2019-03-12 NOTE — Progress Notes (Signed)
by

## 2019-03-21 ENCOUNTER — Inpatient Hospital Stay (HOSPITAL_COMMUNITY)
Admission: AD | Admit: 2019-03-21 | Discharge: 2019-03-21 | Disposition: A | Discharge status: Home / Self Care | Payer: Self-pay | Attending: Obstetrics and Gynecology | Admitting: Obstetrics and Gynecology

## 2019-03-21 ENCOUNTER — Encounter (HOSPITAL_COMMUNITY): Payer: Self-pay | Admitting: Obstetrics and Gynecology

## 2019-03-21 DIAGNOSIS — O99891 Other specified diseases and conditions complicating pregnancy: Secondary | ICD-10-CM | POA: Insufficient documentation

## 2019-03-21 DIAGNOSIS — N949 Unspecified condition associated with female genital organs and menstrual cycle: Secondary | ICD-10-CM

## 2019-03-21 DIAGNOSIS — O4693 Antepartum hemorrhage, unspecified, third trimester: Secondary | ICD-10-CM | POA: Insufficient documentation

## 2019-03-21 DIAGNOSIS — Z3A33 33 weeks gestation of pregnancy: Secondary | ICD-10-CM

## 2019-03-21 DIAGNOSIS — R102 Pelvic and perineal pain: Secondary | ICD-10-CM | POA: Insufficient documentation

## 2019-03-21 DIAGNOSIS — O24419 Gestational diabetes mellitus in pregnancy, unspecified control: Secondary | ICD-10-CM | POA: Insufficient documentation

## 2019-03-21 DIAGNOSIS — Z3493 Encounter for supervision of normal pregnancy, unspecified, third trimester: Secondary | ICD-10-CM

## 2019-03-21 DIAGNOSIS — M549 Dorsalgia, unspecified: Secondary | ICD-10-CM | POA: Insufficient documentation

## 2019-03-21 DIAGNOSIS — O4703 False labor before 37 completed weeks of gestation, third trimester: Secondary | ICD-10-CM

## 2019-03-21 LAB — URINALYSIS, ROUTINE W REFLEX MICROSCOPIC
Bilirubin Urine: NEGATIVE
Glucose, UA: NEGATIVE mg/dL
Hgb urine dipstick: NEGATIVE
Ketones, ur: NEGATIVE mg/dL
Leukocytes,Ua: NEGATIVE
Nitrite: NEGATIVE
Protein, ur: NEGATIVE mg/dL
Specific Gravity, Urine: 1.017 (ref 1.005–1.030)
pH: 6 (ref 5.0–8.0)

## 2019-03-21 LAB — WET PREP, GENITAL
Clue Cells Wet Prep HPF POC: NONE SEEN
Sperm: NONE SEEN
Trich, Wet Prep: NONE SEEN
Yeast Wet Prep HPF POC: NONE SEEN

## 2019-03-21 MED ORDER — CYCLOBENZAPRINE HCL 10 MG PO TABS: 10.0000 mg | ORAL_TABLET | Freq: Two times a day (BID) | ORAL | 0 refills | Status: DC | PRN

## 2019-03-21 NOTE — MAU Note (Addendum)
Deanna Obrien is a 33 y.o. at [redacted]w[redacted]d here in MAU reporting:  That she noted blood in both her 'under garment" and on her toilet paper when she went to the bathroom. "brownish color" Denies recent IC. +contraction. Intermittent. Onset of complaint: yesterday around 3pm. Pain score: "ranges between a 2 and 7 level". 2 currently. Vitals:   03/21/19 1141 03/21/19 1144  BP: 121/71   Pulse: 89   Resp: 16   Temp:  98 F (36.7 C)  SpO2: 99%     +FM Lab orders placed from triage: ua  Spanish Language assistance used: Bethanie Dicker) Cletus Gash [phone]

## 2019-03-21 NOTE — Discharge Instructions (Signed)
Dolor de espalda durante el embarazo Back Pain in Pregnancy El dolor de espalda es habitual durante el embarazo. Puede deberse a varios factores relacionados con los cambios durante esta etapa. Siga estas indicaciones en su casa: Control del dolor, el entumecimiento y la hinchazn      Si se lo indican, para el dolor de espalda repentino (agudo), aplique hielo en la zona dolorida. ? Ponga el hielo en una bolsa plstica. ? Coloque una toalla entre la piel y la bolsa. ? Coloque el hielo durante 20minutos, 2a3veces al da.  Si se lo indican, aplique calor en la zona afectada antes de realizar ejercicios. Use la fuente de calor que el mdico le recomiende, como una compresa de calor hmedo o una almohadilla trmica. ? Coloque una toalla entre la piel y la fuente de calor. ? Aplique calor durante 20 a 30minutos. ? Retire la fuente de calor si la piel se pone de color rojo brillante. Esto es especialmente importante si no puede sentir dolor, calor o fro. Puede correr un riesgo mayor de sufrir quemaduras.  Si se lo indican, aplique un masaje en la zona afectada. Actividad  Haga ejercicio como se lo haya indicado el mdico. Hacer actividad fsica suave es la mejor forma de evitar o controlar el dolor de espalda.  Prstele atencin a su cuerpo cuando se levante. Si siente dolor al levantarse, pida ayuda o flexione las rodillas. De este modo, se usan los msculos de las piernas en lugar de los de la espalda.  Pngase en cuclillas al levantar algo del suelo. No se agache.  Haga reposo en cama nicamente por perodos breves como se lo haya indicado el mdico. El reposo en cama solo debe hacerse cuando los episodios de dolor de espalda son ms intensos. Pararse, sentarse y acostarse  No permanezca sentada o de pie en el mismo lugar durante largos perodos.  Cuando est sentada, adopte una postura correcta. Asegrese de que su cabeza descanse sobre sus hombros y no est colgando hacia  delante. Use una almohada en la parte inferior de la espalda si es necesario.  Trate de dormir de lado, de preferencia del lado izquierdo, con una almohada de sostn para embarazadas o 1 o 2 almohadas comunes entre las piernas. ? Si tiene dolor de espalda despus de una noche de descanso, la cama puede ser demasiado blanda. ? Un colchn duro puede brindarle ms apoyo para la espalda durante el embarazo. Indicaciones generales  No use zapatos con tacones altos.  Siga una dieta saludable. Trate de aumentar de peso dentro de las recomendaciones del mdico.  Use una faja de maternidad, un arns elstico o un cors para la espalda como se lo haya indicado el mdico.  Tome los medicamentos de venta libre y los recetados solamente como se lo haya indicado el mdico.  Trabaje con un fisioterapeuta o un masajista para encontrar maneras de controlar el dolor de espalda. La acupuntura o la terapia de masajes pueden ser tiles.  Concurra a todas las visitas de control como se lo haya indicado el mdico. Esto es importante. Comunquese con un mdico si:  El dolor de espalda le impide realizar las actividades cotidianas.  Aumenta el dolor en otras partes del cuerpo. Solicite ayuda inmediatamente si:  Siente entumecimiento, hormigueo, debilidad o problemas con el uso de los brazos o las piernas.  Siente un dolor de espalda intenso que no puede controlar con los medicamentos.  Presenta modificaciones en el control de la vejiga o el intestino.    Siente que le falta el aire, se marea o se desmaya.  Tiene nuseas, vmitos o sudoracin.  Siente un dolor de espalda que es rtmico y de tipo clico, similar a las contracciones del parto. Las contracciones del parto suelen aparecer cada 1 a 2minutos, duran aproximadamente 1minuto y estn acompaadas de una sensacin de empujar o de presin en la pelvis.  Tiene dolor de espalda y rompe la bolsa de las aguas o tiene sangrado vaginal.  El dolor o el  adormecimiento se extienden hacia la pierna.  El dolor aparece despus de una cada.  Siente dolor de un solo lado.  Observa sangre en la orina.  Le aparecen ampollas en la piel en la zona del dolor de espalda. Resumen  Puede deberse a varios factores relacionados con los cambios durante esta etapa.  Siga las indicaciones del mdico para controlar el dolor, la rigidez y la hinchazn.  Haga ejercicio como se lo haya indicado el mdico. Hacer actividad fsica suave es la mejor forma de evitar o controlar el dolor de espalda.  Tome los medicamentos de venta libre y los recetados solamente como se lo haya indicado el mdico.  Concurra a todas las visitas de control como se lo haya indicado el mdico. Esto es importante. Esta informacin no tiene como fin reemplazar el consejo del mdico. Asegrese de hacerle al mdico cualquier pregunta que tenga. Document Released: 11/22/2010 Document Revised: 10/21/2017 Document Reviewed: 10/21/2017 Elsevier Patient Education  2020 Elsevier Inc.  

## 2019-03-21 NOTE — MAU Provider Note (Signed)
History     CSN: [id number]  Arrival date and time: 03/21/19 1114   First Provider Initiated Contact with Patient 03/21/19 1204      Chief Complaint  Patient presents with  . Vaginal Bleeding  . Contractions   Deanna Obrien is a 33 y.o. G3P2002 at [redacted]w[redacted]d who presents today with contractions and some brown vaginal bleeding noted when she wiped this morning. She states that the contractions started yesterday around 1500. She states that the pain is intermittent. She has not timed the contractions. She rates her pain 6/10. She states that the bleeding was brown today. She noticed this when she was wiping. She reports normal fetal movement. She denies any intercourse in the last 24 hours.     OB History    Gravida  3   Para  2   Term  2   Preterm      AB      Living  2     SAB      TAB      Ectopic      Multiple      Live Births  2           Past Medical History:  Diagnosis Date  . GDM (gestational diabetes mellitus) 01/2019  . Medical history non-contributory     Past Surgical History:  Procedure Laterality Date  . NO PAST SURGERIES      Family History  Problem Relation Age of Onset  . Cirrhosis Father   . Varicose Veins Father     Social History   Tobacco Use  . Smoking status: Never Smoker  . Smokeless tobacco: Never Used  Substance Use Topics  . Alcohol use: Not Currently  . Drug use: Never    Allergies: No Known Allergies  Medications Prior to Admission  Medication Sig Dispense Refill Last Dose  . Prenatal Vit-Fe Fumarate-FA (PRENATAL VITAMINS PO) Take 1 tablet by mouth daily.   03/20/2019 at Unknown time    Review of Systems Physical Exam   Blood pressure 121/71, pulse 89, temperature 98 F (36.7 C), resp. rate 16, weight 80.5 kg, last menstrual period 07/28/2018, SpO2 99 %.  Physical Exam  Nursing note and vitals reviewed. Constitutional: She is oriented to person, place, and time. She appears well-developed and  well-nourished. No distress.  HENT:  Head: Normocephalic.  Cardiovascular: Normal rate.  Respiratory: Effort normal.  GI: There is no abdominal tenderness. There is no rebound.  Genitourinary:    Genitourinary Comments: Cervix: FT/Thick/ballotable    Neurological: She is alert and oriented to person, place, and time.  Skin: Skin is warm and dry.  Psychiatric: She has a normal mood and affect.    NST:  Baseline: 140 Variability: moderate Accels: 15x15 Decels: none Toco: none  Results for orders placed or performed during the hospital encounter of 03/21/19 (from the past 24 hour(s))  Urinalysis, Routine w reflex microscopic     Status: None   Collection Time: 03/21/19 12:16 PM  Result Value Ref Range   Color, Urine YELLOW YELLOW   APPearance CLEAR CLEAR   Specific Gravity, Urine 1.017 1.005 - 1.030   pH 6.0 5.0 - 8.0   Glucose, UA NEGATIVE NEGATIVE mg/dL   Hgb urine dipstick NEGATIVE NEGATIVE   Bilirubin Urine NEGATIVE NEGATIVE   Ketones, ur NEGATIVE NEGATIVE mg/dL   Protein, ur NEGATIVE NEGATIVE mg/dL   Nitrite NEGATIVE NEGATIVE   Leukocytes,Ua NEGATIVE NEGATIVE  Wet prep, genital     Status: Abnormal  Collection Time: 03/21/19 12:18 PM  Result Value Ref Range   Yeast Wet Prep HPF POC NONE SEEN NONE SEEN   Trich, Wet Prep NONE SEEN NONE SEEN   Clue Cells Wet Prep HPF POC NONE SEEN NONE SEEN   WBC, Wet Prep HPF POC MANY (A) NONE SEEN   Sperm NONE SEEN     MAU Course  Procedures  MDM 1410: No cervical change since patient has been here. No contractions on the monitor.   Assessment and Plan   1. Threatened premature labor in third trimester   2. Gestational diabetes mellitus (GDM) in third trimester, gestational diabetes method of control unspecified   3. Encounter for supervision of low-risk pregnancy in third trimester   4. [redacted] weeks gestation of pregnancy   5. Round ligament pain   6. Back pain affecting pregnancy in third trimester    DC home Comfort  measures reviewed  3rd Trimester precautions  PTL precautions  Fetal kick counts RX: flexeril PRN #20  Return to MAU as needed FU with OB as planned  [place] for [facility] Follow up.   Specialty: Obstetrics and Gynecology Contact information: [place] [address] [place], [place] [id number] [place] [phone]           [phone] used for this visit   Marcille Buffy DNP, CNM  03/21/19  2:15 PM

## 2019-03-22 ENCOUNTER — Encounter (HOSPITAL_COMMUNITY): Payer: Self-pay | Admitting: Family Medicine

## 2019-03-22 ENCOUNTER — Other Ambulatory Visit: Payer: Self-pay

## 2019-03-22 ENCOUNTER — Inpatient Hospital Stay (HOSPITAL_COMMUNITY)
Admission: AD | Admit: 2019-03-22 | Discharge: 2019-03-24 | DRG: 805 | Disposition: A | Discharge status: Home / Self Care | Payer: Medicaid Other | Attending: Family Medicine | Admitting: Family Medicine

## 2019-03-22 DIAGNOSIS — Z3A33 33 weeks gestation of pregnancy: Secondary | ICD-10-CM | POA: Diagnosis not present

## 2019-03-22 DIAGNOSIS — Z758 Other problems related to medical facilities and other health care: Secondary | ICD-10-CM | POA: Diagnosis present

## 2019-03-22 DIAGNOSIS — Z789 Other specified health status: Secondary | ICD-10-CM | POA: Diagnosis present

## 2019-03-22 DIAGNOSIS — O24420 Gestational diabetes mellitus in childbirth, diet controlled: Principal | ICD-10-CM | POA: Diagnosis present

## 2019-03-22 DIAGNOSIS — Z20828 Contact with and (suspected) exposure to other viral communicable diseases: Secondary | ICD-10-CM | POA: Diagnosis present

## 2019-03-22 DIAGNOSIS — Z3490 Encounter for supervision of normal pregnancy, unspecified, unspecified trimester: Secondary | ICD-10-CM

## 2019-03-22 DIAGNOSIS — O24419 Gestational diabetes mellitus in pregnancy, unspecified control: Secondary | ICD-10-CM | POA: Diagnosis present

## 2019-03-22 DIAGNOSIS — O4703 False labor before 37 completed weeks of gestation, third trimester: Secondary | ICD-10-CM | POA: Diagnosis present

## 2019-03-22 LAB — COMPREHENSIVE METABOLIC PANEL
ALT: 48 U/L — ABNORMAL HIGH (ref 0–44)
AST: 49 U/L — ABNORMAL HIGH (ref 15–41)
Albumin: 2.7 g/dL — ABNORMAL LOW (ref 3.5–5.0)
Alkaline Phosphatase: 184 U/L — ABNORMAL HIGH (ref 38–126)
Anion gap: 8 (ref 5–15)
BUN: 8 mg/dL (ref 6–20)
CO2: 19 mmol/L — ABNORMAL LOW (ref 22–32)
Calcium: 8.6 mg/dL — ABNORMAL LOW (ref 8.9–10.3)
Chloride: 108 mmol/L (ref 98–111)
Creatinine, Ser: 0.53 mg/dL (ref 0.44–1.00)
GFR calc Af Amer: >60 mL/min (ref >60)
GFR calc non Af Amer: >60 mL/min (ref >60)
Glucose, Bld: 100 mg/dL — ABNORMAL HIGH (ref 70–99)
Potassium: 4 mmol/L (ref 3.5–5.1)
Sodium: 135 mmol/L (ref 135–145)
Total Bilirubin: 0.3 mg/dL (ref 0.3–1.2)
Total Protein: 6.4 g/dL — ABNORMAL LOW (ref 6.5–8.1)

## 2019-03-22 LAB — CBC
HCT: 39.6 % (ref 36.0–46.0)
Hemoglobin: 13.1 g/dL (ref 12.0–15.0)
MCH: 30 pg (ref 26.0–34.0)
MCHC: 33.1 g/dL (ref 30.0–36.0)
MCV: 90.8 fL (ref 80.0–100.0)
Platelets: 321 10*3/uL (ref 150–400)
RBC: 4.36 MIL/uL (ref 3.87–5.11)
RDW: 14.2 % (ref 11.5–15.5)
WBC: 7.6 10*3/uL (ref 4.0–10.5)
nRBC: 0 % (ref 0.0–0.2)

## 2019-03-22 LAB — TYPE AND SCREEN
ABO/RH(D): O POS
Antibody Screen: NEGATIVE

## 2019-03-22 LAB — RESPIRATORY PANEL BY RT PCR (FLU A&B, COVID)
Influenza A by PCR: NEGATIVE
Influenza B by PCR: NEGATIVE
SARS Coronavirus 2 by RT PCR: NEGATIVE

## 2019-03-22 MED ORDER — TETANUS-DIPHTH-ACELL PERTUSSIS 5-2.5-18.5 LF-MCG/0.5 IM SUSP: 0.5000 mL | Freq: Once | INTRAMUSCULAR | Status: DC

## 2019-03-22 MED ORDER — IBUPROFEN 600 MG PO TABS
600.0000 mg | ORAL_TABLET | Freq: Four times a day (QID) | ORAL | Status: DC
  Administered 2019-03-22 – 2019-03-24 (×6): 600 mg via ORAL
  Filled 2019-03-22 (×6): qty 1

## 2019-03-22 MED ORDER — TERBUTALINE SULFATE 1 MG/ML IJ SOLN: 0.2500 mg | Freq: Once | INTRAMUSCULAR | Status: DC

## 2019-03-22 MED ORDER — METHYLERGONOVINE MALEATE 0.2 MG/ML IJ SOLN
0.2000 mg | Freq: Once | INTRAMUSCULAR | Status: AC
  Administered 2019-03-22: 19:00:00 0.2 mg via INTRAMUSCULAR

## 2019-03-22 MED ORDER — ACETAMINOPHEN 325 MG PO TABS
650.0000 mg | ORAL_TABLET | ORAL | Status: DC | PRN
  Administered 2019-03-22: 20:00:00 650 mg via ORAL
  Filled 2019-03-22: qty 2

## 2019-03-22 MED ORDER — ONDANSETRON HCL 4 MG PO TABS: 4.0000 mg | ORAL_TABLET | ORAL | Status: DC | PRN

## 2019-03-22 MED ORDER — DIBUCAINE (PERIANAL) 1 % EX OINT: 1.0000 "application " | TOPICAL_OINTMENT | CUTANEOUS | Status: DC | PRN

## 2019-03-22 MED ORDER — NIFEDIPINE 10 MG PO CAPS: 10.0000 mg | ORAL_CAPSULE | Freq: Once | ORAL | Status: DC

## 2019-03-22 MED ORDER — BETAMETHASONE SOD PHOS & ACET 6 (3-3) MG/ML IJ SUSP
12.0000 mg | INTRAMUSCULAR | Status: DC
  Administered 2019-03-22: 13:00:00 12 mg via INTRAMUSCULAR
  Filled 2019-03-22: qty 5

## 2019-03-22 MED ORDER — PRENATAL MULTIVITAMIN CH
1.0000 | ORAL_TABLET | Freq: Every day | ORAL | Status: DC
  Administered 2019-03-22: 16:00:00 1 via ORAL
  Filled 2019-03-22: qty 1

## 2019-03-22 MED ORDER — BENZOCAINE-MENTHOL 20-0.5 % EX AERO: 1.0000 "application " | INHALATION_SPRAY | CUTANEOUS | Status: DC | PRN

## 2019-03-22 MED ORDER — SODIUM CHLORIDE 0.9 % IV SOLN
2.0000 g | Freq: Once | INTRAVENOUS | Status: AC
  Administered 2019-03-22: 19:00:00 2 g via INTRAVENOUS
  Filled 2019-03-22: qty 2000

## 2019-03-22 MED ORDER — DIPHENHYDRAMINE HCL 25 MG PO CAPS: 25.0000 mg | ORAL_CAPSULE | Freq: Four times a day (QID) | ORAL | Status: DC | PRN

## 2019-03-22 MED ORDER — PRENATAL VITAMINS 28-0.8 MG PO TABS: ORAL_TABLET | Freq: Every day | ORAL | Status: DC

## 2019-03-22 MED ORDER — ONDANSETRON HCL 4 MG/2ML IJ SOLN: 4.0000 mg | INTRAMUSCULAR | Status: DC | PRN

## 2019-03-22 MED ORDER — MAGNESIUM SULFATE 40 GM/1000ML IV SOLN
2.0000 g/h | INTRAVENOUS | Status: DC
  Filled 2019-03-22: qty 1000

## 2019-03-22 MED ORDER — TERBUTALINE SULFATE 1 MG/ML IJ SOLN
INTRAMUSCULAR | Status: AC
  Administered 2019-03-22: 0.25 mg via SUBCUTANEOUS
  Filled 2019-03-22: qty 1

## 2019-03-22 MED ORDER — SIMETHICONE 80 MG PO CHEW: 80.0000 mg | CHEWABLE_TABLET | ORAL | Status: DC | PRN

## 2019-03-22 MED ORDER — NIFEDIPINE 10 MG PO CAPS
10.0000 mg | ORAL_CAPSULE | Freq: Once | ORAL | Status: AC
  Administered 2019-03-22: 10 mg via ORAL
  Filled 2019-03-22: qty 1

## 2019-03-22 MED ORDER — METHYLERGONOVINE MALEATE 0.2 MG/ML IJ SOLN
INTRAMUSCULAR | Status: AC
  Filled 2019-03-22: qty 1

## 2019-03-22 MED ORDER — FENTANYL CITRATE (PF) 100 MCG/2ML IJ SOLN
50.0000 ug | Freq: Once | INTRAMUSCULAR | Status: AC
  Administered 2019-03-22: 50 ug via INTRAVENOUS

## 2019-03-22 MED ORDER — MISOPROSTOL 200 MCG PO TABS
ORAL_TABLET | ORAL | Status: AC
  Filled 2019-03-22: qty 4

## 2019-03-22 MED ORDER — TRANEXAMIC ACID-NACL 1000-0.7 MG/100ML-% IV SOLN
1000.0000 mg | INTRAVENOUS | Status: AC
  Administered 2019-03-22: 19:00:00 1000 mg via INTRAVENOUS

## 2019-03-22 MED ORDER — FENTANYL CITRATE (PF) 100 MCG/2ML IJ SOLN
INTRAMUSCULAR | Status: AC
  Filled 2019-03-22: qty 2

## 2019-03-22 MED ORDER — LACTATED RINGERS IV BOLUS
1000.0000 mL | Freq: Once | INTRAVENOUS | Status: AC
  Administered 2019-03-22: 13:00:00 1000 mL via INTRAVENOUS

## 2019-03-22 MED ORDER — PRENATAL MULTIVITAMIN CH
1.0000 | ORAL_TABLET | Freq: Every day | ORAL | Status: DC
  Administered 2019-03-23: 1 via ORAL
  Filled 2019-03-22: qty 1

## 2019-03-22 MED ORDER — OXYTOCIN 40 UNITS IN NORMAL SALINE INFUSION - SIMPLE MED
INTRAVENOUS | Status: AC
  Filled 2019-03-22: qty 1000

## 2019-03-22 MED ORDER — DOCUSATE SODIUM 100 MG PO CAPS
100.0000 mg | ORAL_CAPSULE | Freq: Every day | ORAL | Status: DC
  Administered 2019-03-22: 100 mg via ORAL
  Filled 2019-03-22: qty 1

## 2019-03-22 MED ORDER — COCONUT OIL OIL: 1.0000 "application " | TOPICAL_OIL | Status: DC | PRN

## 2019-03-22 MED ORDER — MAGNESIUM SULFATE BOLUS VIA INFUSION
4.0000 g | Freq: Once | INTRAVENOUS | Status: AC
  Administered 2019-03-22: 13:00:00 4 g via INTRAVENOUS
  Filled 2019-03-22: qty 1000

## 2019-03-22 MED ORDER — CYCLOBENZAPRINE HCL 10 MG PO TABS
10.0000 mg | ORAL_TABLET | Freq: Two times a day (BID) | ORAL | Status: DC | PRN
  Filled 2019-03-22: qty 1

## 2019-03-22 MED ORDER — SODIUM CHLORIDE 0.9 % IV SOLN
2.0000 g | Freq: Once | INTRAVENOUS | Status: AC
  Administered 2019-03-22: 2 g via INTRAVENOUS
  Filled 2019-03-22: qty 2000

## 2019-03-22 MED ORDER — SENNOSIDES-DOCUSATE SODIUM 8.6-50 MG PO TABS
2.0000 | ORAL_TABLET | ORAL | Status: DC
  Administered 2019-03-22 – 2019-03-24 (×2): 2 via ORAL
  Filled 2019-03-22 (×2): qty 2

## 2019-03-22 MED ORDER — WITCH HAZEL-GLYCERIN EX PADS: 1.0000 "application " | MEDICATED_PAD | CUTANEOUS | Status: DC | PRN

## 2019-03-22 MED ORDER — ACETAMINOPHEN 325 MG PO TABS
650.0000 mg | ORAL_TABLET | ORAL | Status: DC | PRN
  Administered 2019-03-23 – 2019-03-24 (×2): 650 mg via ORAL
  Filled 2019-03-22 (×2): qty 2

## 2019-03-22 MED ORDER — TERBUTALINE SULFATE 1 MG/ML IJ SOLN
INTRAMUSCULAR | Status: AC
  Filled 2019-03-22: qty 1

## 2019-03-22 MED ORDER — TRANEXAMIC ACID-NACL 1000-0.7 MG/100ML-% IV SOLN
INTRAVENOUS | Status: AC
  Filled 2019-03-22: qty 100

## 2019-03-22 MED ORDER — LACTATED RINGERS IV SOLN: INTRAVENOUS | Status: DC

## 2019-03-22 MED ORDER — MISOPROSTOL 200 MCG PO TABS
800.0000 ug | ORAL_TABLET | Freq: Once | ORAL | Status: AC
  Administered 2019-03-22: 19:00:00 800 ug via BUCCAL

## 2019-03-22 MED ORDER — ZOLPIDEM TARTRATE 5 MG PO TABS: 5.0000 mg | ORAL_TABLET | Freq: Every evening | ORAL | Status: DC | PRN

## 2019-03-22 MED ORDER — TERBUTALINE SULFATE 1 MG/ML IJ SOLN: 0.2500 mg | Freq: Once | INTRAMUSCULAR | Status: AC

## 2019-03-22 MED ORDER — CALCIUM CARBONATE ANTACID 500 MG PO CHEW: 2.0000 | CHEWABLE_TABLET | ORAL | Status: DC | PRN

## 2019-03-22 NOTE — MAU Note (Signed)
Deanna Obrien is a 34 y.o. at [redacted]w[redacted]d here in MAU reporting:  Worsening contractions since her MAU visit yesterday. States the medication she was given is not helping. +bloody show Denies LOF Pain score: 7/10 Vitals:   03/22/19 1205  BP: 113/72  Pulse: 87  Resp: 16  Temp: 98.1 F (36.7 C)  SpO2: 100%    FHT:+FM; 163 efm Lab orders placed from triage: ua. Patient unable to go as she just went to the restroom.  Hospital Language assistance used

## 2019-03-22 NOTE — Progress Notes (Signed)
Dr. Nehemiah Settle at bedside. Pt was complete and pt transferred via bed to l&d. Accompanied by her spouse. Report given to oncoming RN.

## 2019-03-22 NOTE — Discharge Summary (Signed)
Postpartum Discharge Summary     Patient Name: Deanna Obrien DOB: 1985/11/05 MRN: [id number]  Date of admission: 03/22/2019 Delivering Provider: Wende Mott   Date of discharge: 03/24/2019  Admitting diagnosis: Threatened preterm labor, third trimester [O47.03] Intrauterine pregnancy: [redacted]w[redacted]d    Secondary diagnosis:  Principal Problem:   Preterm labor Active Problems:   Supervision of low-risk pregnancy   Language barrier affecting health care   GDM (gestational diabetes mellitus)   Threatened preterm labor, third trimester  Additional problems:      Discharge diagnosis: Preterm Pregnancy Delivered and GDM A1                                                                                                Post partum procedures:n/a  Augmentation: AROM  Complications: None  Hospital course:  Onset of Labor With Vaginal Delivery     33y.o. yo G3P2002 at 323w6das admitted in Latent Labor on 03/22/2019. Patient had an uncomplicated labor course as follows:  Membrane Rupture Time/Date: 7:00 PM ,03/22/2019   Intrapartum Procedures: Episiotomy: None [1]                                         Lacerations:  None [1]  Patient had a delivery of a Viable infant. 03/22/2019  Information for the patient's newborn:  LoJamela CumboGirl [id number]]Delivery Method: Vaginal, Spontaneous(Filed from Delivery Summary)     Pateint had an uncomplicated postpartum course.  She is ambulating, tolerating a regular diet, passing flatus, and urinating well. Patient is discharged home in stable condition on 03/24/19. Reviewed precautions, she will need 2 hr GTT follow up.  Interview done via SpPatent attorney Delivery time: 7:01 PM    Magnesium Sulfate received: Yes BMZ received: Yes Rhophylac:No MMR:No Transfusion:No  Physical exam  Vitals:   03/23/19 1619 03/23/19 1929 03/24/19 0159 03/24/19 0459  BP: (!) 93/52 (!) 97/52 (!) 91/49 (!) 99/55  Pulse: 63 65 (!)  56 (!) 58  Resp: _0 Temp: 98.7 F (37.1 C) 98.1 F (36.7 C) 98 F (36.7 C) 98.5 F (36.9 C)  TempSrc: Oral Oral Oral Oral  SpO2: 98% 97%  98%  Weight:       General: alert, cooperative and no distress Lochia: appropriate Uterine Fundus: firm Incision: N/A DVT Evaluation: No evidence of DVT seen on physical exam. Labs: Lab Results  Component Value Date   WBC 16.3 (H) 03/23/2019   HGB 12.4 03/23/2019   HCT 39.0 03/23/2019   MCV 93.3 03/23/2019   PLT 314 03/23/2019   CMP Latest Ref Rng & Units 03/22/2019  Glucose 70 - 99 mg/dL 100(H)  BUN 6 - 20 mg/dL 8  Creatinine 0.44 - 1.00 mg/dL 0.53  Sodium 135 - 145 mmol/L 135  Potassium 3.5 - 5.1 mmol/L 4.0  Chloride 98 - 111 mmol/L 108  CO2 22 - 32 mmol/L 19(L)  Calcium 8.9 - 10.3 mg/dL 8.6(L)  Total Protein 6.5 -  8.1 g/dL 6.4(L)  Total Bilirubin 0.3 - 1.2 mg/dL 0.3  Alkaline Phos 38 - 126 U/L 184(H)  AST 15 - 41 U/L 49(H)  ALT 0 - 44 U/L 48(H)    Discharge instruction: per After Visit Summary and "Baby and Me Booklet".  After visit meds:  Allergies as of 03/24/2019   No Known Allergies     Medication List    STOP taking these medications   cyclobenzaprine 10 MG tablet Commonly known as: FLEXERIL   PRENATAL VITAMINS PO     TAKE these medications   ibuprofen 600 MG tablet Commonly known as: ADVIL Take 1 tablet (600 mg total) by mouth every 6 (six) hours.       Diet: carb modified diet  Activity: Advance as tolerated. Pelvic rest for 6 weeks.   Outpatient follow up:4 weeks Follow up Appt: Future Appointments  Date Time Provider [place]  03/30/2019 10:15 AM Woodroe Mode, MD [facility]   Follow up Visit: Please schedule this patient for PP visit in: 4 weeks High risk pregnancy complicated by: GDM Delivery mode:  SVD Anticipated Birth Control:  IUD PP Procedures needed: 2 hour GTT  Schedule Integrated BH visit: no Provider: Any provider  Newborn Data: Live born female  Birth  Weight:   APGAR: ,   Newborn Delivery   Birth date/time: 03/22/2019 19:01:00 Delivery type: Vaginal, Spontaneous      Baby Feeding: Bottle and Breast Disposition:NICU   K. Arvilla Meres, M.D. Attending Center for [employer] Fish farm manager)

## 2019-03-22 NOTE — Plan of Care (Signed)
  Problem: Education: °Goal: Knowledge of disease or condition will improve °Outcome: Completed/Met °Goal: Knowledge of the prescribed therapeutic regimen will improve °Outcome: Completed/Met °Goal: Individualized Educational Video(s) °Outcome: Completed/Met °  °Problem: Clinical Measurements: °Goal: Complications related to the disease process, condition or treatment will be avoided or minimized °Outcome: Completed/Met °  °

## 2019-03-22 NOTE — H&P (Signed)
History     CSN: [id number]  Arrival date and time: 03/22/19 1144   First Provider Initiated Contact with Patient 03/22/19 1217      Chief Complaint  Patient presents with  . Contractions   HPI Deanna Obrien is a 33 y.o. G3P2002 at [redacted]w[redacted]d who presents with contractions. She was seen yesterday in MAU and 0.5cm. She states the contractions got worse this morning and became closer together. She also reports seeing bleeding when she wipes. She reports normal fetal movement. Denies leaking of fluid.   OB History    Gravida  3   Para  2   Term  2   Preterm      AB      Living  2     SAB      TAB      Ectopic      Multiple      Live Births  2           Past Medical History:  Diagnosis Date  . GDM (gestational diabetes mellitus) 01/2019  . Medical history non-contributory     Past Surgical History:  Procedure Laterality Date  . NO PAST SURGERIES      Family History  Problem Relation Age of Onset  . Cirrhosis Father   . Varicose Veins Father     Social History   Tobacco Use  . Smoking status: Never Smoker  . Smokeless tobacco: Never Used  Substance Use Topics  . Alcohol use: Not Currently  . Drug use: Never    Allergies: No Known Allergies  Medications Prior to Admission  Medication Sig Dispense Refill Last Dose  . cyclobenzaprine (FLEXERIL) 10 MG tablet Take 1 tablet (10 mg total) by mouth 2 (two) times daily as needed for muscle spasms. 20 tablet 0   . Prenatal Vit-Fe Fumarate-FA (PRENATAL VITAMINS PO) Take 1 tablet by mouth daily.       Review of Systems  Constitutional: Negative.  Negative for fatigue and fever.  HENT: Negative.   Respiratory: Negative.  Negative for shortness of breath.   Cardiovascular: Negative.  Negative for chest pain.  Gastrointestinal: Positive for abdominal pain. Negative for constipation, diarrhea, nausea and vomiting.  Genitourinary: Positive for vaginal bleeding. Negative for dysuria.   Neurological: Negative.  Negative for dizziness and headaches.   Physical Exam   Blood pressure 108/69, pulse 91, temperature 98 F (36.7 C), temperature source Oral, resp. rate 17, last menstrual period 07/28/2018, SpO2 97 %.  Physical Exam  Nursing note and vitals reviewed. Constitutional: She is oriented to person, place, and time. She appears well-developed and well-nourished. No distress.  HENT:  Head: Normocephalic.  Eyes: Pupils are equal, round, and reactive to light.  Cardiovascular: Normal rate, regular rhythm and normal heart sounds.  Respiratory: Effort normal and breath sounds normal. No respiratory distress.  GI: Soft. Bowel sounds are normal. She exhibits no distension. There is no abdominal tenderness.  Neurological: She is alert and oriented to person, place, and time.  Skin: Skin is warm and dry.  Psychiatric: She has a normal mood and affect. Her behavior is normal. Judgment and thought content normal.   Fetal Tracing:  Baseline: 150 Variability: moderate Accels: 15x15 Decels: variable  Toco: 4-5  Dilation: 4 Effacement (%): 100 Station: 0 Presentation: Vertex Exam by:: Cleone Slim, CNM   MAU Course  Procedures Results for orders placed or performed during the hospital encounter of 03/22/19 (from the past 24 hour(s))  CBC  Status: None   Collection Time: 03/22/19 12:30 PM  Result Value Ref Range   WBC 7.6 4.0 - 10.5 K/uL   RBC 4.36 3.87 - 5.11 MIL/uL   Hemoglobin 13.1 12.0 - 15.0 g/dL   HCT 39.6 36.0 - 46.0 %   MCV 90.8 80.0 - 100.0 fL   MCH 30.0 26.0 - 34.0 pg   MCHC 33.1 30.0 - 36.0 g/dL   RDW 14.2 11.5 - 15.5 %   Platelets 321 150 - 400 K/uL   nRBC 0.0 0.0 - 0.2 %  Type and screen [place]     Status: None   Collection Time: 03/22/19 12:30 PM  Result Value Ref Range   ABO/RH(D) O POS    Antibody Screen NEG    Sample Expiration      03/25/2019,2359 Performed at [facility], [place] [address]  [place], [place] [address]   Comprehensive metabolic panel     Status: Abnormal   Collection Time: 03/22/19 12:30 PM  Result Value Ref Range   Sodium 135 135 - 145 mmol/L   Potassium 4.0 3.5 - 5.1 mmol/L   Chloride 108 98 - 111 mmol/L   CO2 19 (L) 22 - 32 mmol/L   Glucose, Bld 100 (H) 70 - 99 mg/dL   BUN 8 6 - 20 mg/dL   Creatinine, Ser 0.53 0.44 - 1.00 mg/dL   Calcium 8.6 (L) 8.9 - 10.3 mg/dL   Total Protein 6.4 (L) 6.5 - 8.1 g/dL   Albumin 2.7 (L) 3.5 - 5.0 g/dL   AST 49 (H) 15 - 41 U/L   ALT 48 (H) 0 - 44 U/L   Alkaline Phosphatase 184 (H) 38 - 126 U/L   Total Bilirubin 0.3 0.3 - 1.2 mg/dL   GFR calc non Af Amer >60 >60 mL/min   GFR calc Af Amer >60 >60 mL/min   Anion gap 8 5 - 15  Respiratory Panel by RT PCR (Flu A&B, Covid) - Nasopharyngeal Swab     Status: None   Collection Time: 03/22/19  1:06 PM   Specimen: Nasopharyngeal Swab  Result Value Ref Range   SARS Coronavirus 2 by RT PCR NEGATIVE NEGATIVE   Influenza A by PCR NEGATIVE NEGATIVE   Influenza B by PCR NEGATIVE NEGATIVE     MDM 4cm/100/0 with bulging bag and bloody show NICU High Census Consulted with Dr. Nehemiah Settle. Recommends terbutaline, IV, magnesium, betamethasone and observation in an attempt to stop her labor.   Ampicillin IV COVID swab  Cervix unchanged after 2 hours. Reactive FHR tracing. Dr. Nehemiah Settle updated. Per Neonatalogy, ok to observe patient at [facility]. Will admit to [facility].   Assessment and Plan  Preterm labor  Admit to [facility] MD to put in orders and assume care of patient.  Wende Mott CNM 03/22/2019, 2:18 PM

## 2019-03-23 LAB — CBC
HCT: 39 % (ref 36.0–46.0)
Hemoglobin: 12.4 g/dL (ref 12.0–15.0)
MCH: 29.7 pg (ref 26.0–34.0)
MCHC: 31.8 g/dL (ref 30.0–36.0)
MCV: 93.3 fL (ref 80.0–100.0)
Platelets: 314 10*3/uL (ref 150–400)
RBC: 4.18 MIL/uL (ref 3.87–5.11)
RDW: 14.3 % (ref 11.5–15.5)
WBC: 16.3 10*3/uL — ABNORMAL HIGH (ref 4.0–10.5)
nRBC: 0 % (ref 0.0–0.2)

## 2019-03-23 LAB — GC/CHLAMYDIA PROBE AMP (~~LOC~~) NOT AT ARMC
Chlamydia: NEGATIVE
Comment: NEGATIVE
Comment: NORMAL
Neisseria Gonorrhea: NEGATIVE

## 2019-03-23 LAB — GLUCOSE, CAPILLARY: Glucose-Capillary: 95 mg/dL (ref 70–99)

## 2019-03-23 NOTE — Progress Notes (Signed)
Ordered pt meals, by Viria Alvarez Spanish Interpreter. 

## 2019-03-23 NOTE — Lactation Note (Addendum)
This note was copied from a baby's chart. Lactation Consultation Note  Patient Name: Deanna Obrien TAVWP'V Date: 03/23/2019 Reason for consult: Initial assessment;NICU baby;Infant < 6lbs;Preterm <34wks P2, 10 hour preterm infant in NICU. Currently infant is receiving donor milk that is being tube fed.  Per mom, she doesn't have breast pump at home and she is not on the [facility] program. Mom plans to purchase a DEBP. LC gave mom hand pump and explained how to use. Mom will use DEBP every 3 hours on initial setting for 15 minutes to help establish and maintain milk supply due mom being separated from infant due to infant being in NICU. Mom shown how to use DEBP & how to disassemble, clean, & reassemble parts. LC discussed hand expression and mom taught back a few drops were expressed and labels will be given by RN for dad to take to NICU. Parents understand EBM with labels  must be taken to NICU  within 4 hours.  Mom will follow NICU infant feeding policies for pre-term infants.   Maternal Data Formula Feeding for Exclusion: No Has patient been taught Hand Expression?: Yes Does the patient have breastfeeding experience prior to this delivery?: Yes  Feeding Feeding Type: Donor Breast Milk  LATCH Score                   Interventions Interventions: Breast feeding basics reviewed;Expressed milk;Hand express;DEBP;Hand pump  Lactation Tools Discussed/Used WIC Program: No Pump Review: Setup, frequency, and cleaning;Milk Storage Initiated by:: Vicente Serene, IBCLC Date initiated:: 03/23/19   Consult Status Consult Status: Follow-up Date: 03/23/19 Follow-up type: In-patient    Vicente Serene 03/23/2019, 5:39 AM

## 2019-03-23 NOTE — Progress Notes (Signed)
Faculty Attending Note  Post Partum Day 1  Subjective: Patient is feeling well. She reports moderately well controlled pain on PO pain meds. She is ambulating and denies light-headedness or dizziness. She is passing flatus. She is tolerating a regular diet without nausea/vomiting. Bleeding is moderate. She is breast feeding. Baby is in NICU and doing well.  Objective: Blood pressure (!) 104/59, pulse 77, temperature 98.4 F (36.9 C), temperature source Oral, resp. rate 17, weight 80.5 kg, last menstrual period 07/28/2018, SpO2 99 %, unknown if currently breastfeeding. Temp:  [98 F (36.7 C)-98.8 F (37.1 C)] 98.4 F (36.9 C) (12/28 0751) Pulse Rate:  [76-112] 77 (12/28 0751) Resp:  [16-23] 17 (12/28 0751) BP: (103-122)/(53-72) 104/59 (12/28 0751) SpO2:  [95 %-100 %] 99 % (12/28 0751) Weight:  [80.5 kg] 80.5 kg (12/27 1507)  Physical Exam:  General: alert, oriented, cooperative Chest: normal respiratory effort Heart: RRR  Abdomen: soft, appropriately tender to palpation  Uterine Fundus: firm, 2 fingers below the umbilicus Lochia: moderate, rubra DVT Evaluation: no evidence of DVT Extremities: no edema, no calf tenderness  UOP: voiding spontaneously  Recent Labs    03/22/19 1230 03/23/19 0502  HGB 13.1 12.4  HCT 39.6 39.0    Assessment/Plan: Patient Active Problem List   Diagnosis Date Noted  . Threatened preterm labor, third trimester 03/22/2019  . GDM (gestational diabetes mellitus) 02/15/2019  . Language barrier affecting health care 01/28/2019  . Supervision of low-risk pregnancy 10/09/2018    Patient is 33 y.o. G4W1027 PPD#1 s/p SVD at [redacted]w[redacted]d. C/b small hemorrhage with stable H/H. She is doing well, recovering appropriately and complains only of some pain.   Continue routine post partum care Pain meds prn Regular diet IUD in office for birth control Plan for discharge tomorrow  Spanish translator used for interview    K. Arvilla Meres,  M.D. Attending Center for [employer] (Faculty Practice)  03/23/2019, 11:24 AM

## 2019-03-23 NOTE — Progress Notes (Signed)
Patient screened out for psychosocial assessment since none of the following apply:  Psychosocial stressors documented in mother or baby's chart  Gestation less than 32 weeks  Code at delivery   Infant with anomalies Please contact the Clinical Social Worker if specific needs arise, by MOB's request, or if MOB scores greater than 9/yes to question 10 on Edinburgh Postpartum Depression Screen.  Aster Screws, LCSW Clinical Social Worker [facility] Cell#: [phone]     

## 2019-03-24 LAB — CULTURE, BETA STREP (GROUP B ONLY)

## 2019-03-24 LAB — SURGICAL PATHOLOGY

## 2019-03-24 MED ORDER — IBUPROFEN 600 MG PO TABS: 600.0000 mg | ORAL_TABLET | Freq: Four times a day (QID) | ORAL | 0 refills | Status: AC

## 2019-03-24 NOTE — Lactation Note (Signed)
This note was copied from a baby's chart. Lactation Consultation Note  Patient Name: Deanna Obrien OQHUT'M Date: 03/24/2019 Reason for consult: Follow-up assessment;NICU baby;Infant < 6lbs;Preterm <34wks;Other (Comment)(mom being D/C today/ Deanna Obrien [facility] spanish interpreter)  Deanna Obrien is 16 hours old  Mom has been NICU most of the day.  LC reviewed supply and demand and the importance of consistent pumping around the clock both breast for 15- 20 mins / save milk for baby in NICU.  Mom denies soreness and is pumping 8 x's in the last 24 hours and not getting milk as of yet.  Per mom feeling her breast are fuller. LC offered to check to see if mom was engorged with permission and mom receptive. Neither breast were engorged, just filling on the sides .  Per mom doesn't have a DEBP at home or active with [facility] .  LC offered to sent a referral today to [facility] and mom consented.  LC recommended to mom to call [facility] and showed her the phone number.  LC showed mom how to use the DEBP kit pump manually and hand pump ( as requested by mom) .  LC recommended to mom when she is in NICU To  Plan on pumping at least 2 times if there for 6-8 hours and hand express.  LC provided storage bottles and the NICU booklet on storage.  Mom has the [facility] resources and the phone numbers.    Maternal Data Has patient been taught Hand Expression?: Yes  Feeding    LATCH Score                   Interventions Interventions: Breast feeding basics reviewed  Lactation Tools Discussed/Used Tools: Pump;Flanges Flange Size: 24 Breast pump type: Double-Electric Breast Pump;Manual WIC Program: No(LC faxed a referral today with mom permission and asked her to call [place] today) Pump Review: Milk Storage;Setup, frequency, and cleaning;Other (comment)(LC reviewed the set up of the DEBP, manual, DEBP kit manual)   Consult Status Consult Status: PRN Date: (baby in NICU [facility]) Follow-up type:  In-patient    [place] 03/24/2019, 3:53 PM

## 2019-03-24 NOTE — Progress Notes (Signed)
Discharge complete with the use of an interpretor

## 2019-03-27 ENCOUNTER — Ambulatory Visit: Payer: Self-pay

## 2019-03-27 NOTE — Lactation Note (Signed)
This note was copied from a baby's chart. Lactation Consultation Note  Patient Name: Deanna Obrien XFGHW'E Date: 03/27/2019 Reason for consult: Follow-up assessment;Infant < 6lbs;Late-preterm 34-36.6wks;NICU baby  LC in to assist with positioning baby at the breast for first time.  Baby is 71 days old and AGA of [redacted]w[redacted]d.    Mom has been pumping every 3 hrs and volume is increasing.  Breasts are full, not engorged.  Using IPAD interpreter, assisted Mom with holding baby in cross cradle hold.  Baby fussing, but once placed near breast, baby opened her mouth widely and tried to latch herself.  Assisted Mom in sandwiching her breast in U hold and supporting baby's head from ear to ear.  Baby repeatedly needed to be re-latched as she would slip onto nipple causing nipple to be pinched.  Initiated a 20 mm nipple shield, showing Mom how to properly apply it.  Nipple pulled well into shield.  Baby able to attain a deeper latch with the nipple shield.  Baby sucked/swallowed on and off, but became very sleepy after 10 mins.  Mom taught to use breast compression during sucking to increase milk transfer.  Reassured Mom and FOB that baby was doing very well for first time at the breast.  Encouraged Mom to continue to double pump after breastfeeding while baby was learning.   When baby came off breast, milk noted in end of shield.    Mom denies having any questions.     Feeding Feeding Type: Breast Fed  LATCH Score Latch: Repeated attempts needed to sustain latch, nipple held in mouth throughout feeding, stimulation needed to elicit sucking reflex.  Audible Swallowing: A few with stimulation  Type of Nipple: Everted at rest and after stimulation  Comfort (Breast/Nipple): Soft / non-tender  Hold (Positioning): Assistance needed to correctly position infant at breast and maintain latch.  LATCH Score: 7  Interventions Interventions: Breast feeding basics reviewed;Assisted with latch;Skin to  skin;Breast massage;Hand express;Breast compression;Adjust position;Support pillows;Position options;Expressed milk;DEBP  Lactation Tools Discussed/Used Tools: Pump;Nipple Shields Nipple shield size: 20 Breast pump type: Double-Electric Breast Pump   Consult Status Consult Status: Follow-up Date: 03/28/19 Follow-up type: In-patient    Judee Clara 03/27/2019, 12:29 PM

## 2019-03-30 ENCOUNTER — Encounter: Payer: Self-pay | Admitting: Obstetrics & Gynecology

## 2019-03-31 ENCOUNTER — Ambulatory Visit: Payer: Self-pay

## 2019-03-31 NOTE — Lactation Note (Signed)
This note was copied from a baby's chart. Lactation Consultation Note  Patient Name: Deanna Obrien NOIBB'C Date: 03/31/2019   Alecia [id number]" was used for consult:   Until recently, Mom had been pumping q3h and getting 3-4 mL with pumping (Mom showed me with her fingers about how much she was getting, which seemed around 5 mL). Mom says she had been told to stop pumping since infant was now going to the breast. Although infant has Latch scores of 10 & has demonstrated weight gain recently, I cautioned Mom about discontinuing pumping too soon for this infant that has a CGA of 35 weeks.  Although Mom lactated for 1 year with her 1st child (now 12 yo), she mixed fed during that same time. Mom was unable to describe in a measurable sense (qualitatively or quantitatively) what her supply was like with her 1st child.   Mom does report that this infant drains her breasts at feeding, but as that can still be true in the setting of low milk supply, I've asked Mom to continue pumping about 6-8 times/day. Once sustained weight gain from exclusive breastfeeding is observed, pumping sessions can be decreased.   Lurline Hare [facility] 03/31/2019, 2:12 PM

## 2019-04-05 ENCOUNTER — Ambulatory Visit: Payer: Self-pay

## 2019-04-05 NOTE — Lactation Note (Signed)
This note was copied from a baby's chart. Lactation Consultation Note  Patient Name: Deanna Obrien EQAST'M Date: 04/05/2019 Reason for consult: Follow-up assessment;Infant < 6lbs;NICU baby;Late-preterm 35-36.6wks  Visited with mom of 104 week old pre-term NICU infant, parents are taking baby home today, she's still < 6 lbs though but per mom BF is going well, she's having LATCH scores of 10; mom is  P2. She's still using a NS #20 and has discontinued pumping, because "baby is already getting milk from the breast. Explained to mom that she should still pump in order to protect her supply, especially if notices that baby starts getting sleepy during the feedings at the breast.  Mom is putting baby to breast every 2-3 hours (they told LC every 4 hours at first like they're told, but then took it back and said 2-3 hours) reminded parents about cluster feeding episodes when baby may want to go to breast more often than that, especially during growth spurts, they both voiced understanding.  Baby is also getting supplemented with Similac 22 calorie formula using an Nfant extra slow flow nipple. Encouraged mom to keep putting baby to breast at least 8-12 times/24 hours or sooner if feeding cues are present. Parents are Spanish speakers, they reported all questions and concerns were answered, they're both aware of LC OP services and will call PRN.   Maternal Data    Feeding Feeding Type: Breast Fed Nipple Type: Nfant Extra Slow Flow (gold)  LATCH Score Latch: Grasps breast easily, tongue down, lips flanged, rhythmical sucking.  Audible Swallowing: Spontaneous and intermittent  Type of Nipple: Everted at rest and after stimulation  Comfort (Breast/Nipple): Soft / non-tender  Hold (Positioning): No assistance needed to correctly position infant at breast.  LATCH Score: 10  Interventions Interventions: Breast feeding basics reviewed  Lactation Tools Discussed/Used Tools: Nipple  [name]   Consult Status Consult Status: Complete Date: 04/05/19 Follow-up type: Call as needed    [name] [name] 04/05/2019, 2:04 PM

## 2019-04-23 ENCOUNTER — Ambulatory Visit (INDEPENDENT_AMBULATORY_CARE_PROVIDER_SITE_OTHER): Payer: Self-pay | Admitting: Obstetrics and Gynecology

## 2019-04-23 ENCOUNTER — Other Ambulatory Visit: Payer: Self-pay

## 2019-04-23 ENCOUNTER — Encounter: Payer: Self-pay | Admitting: Obstetrics and Gynecology

## 2019-04-23 DIAGNOSIS — Z1389 Encounter for screening for other disorder: Secondary | ICD-10-CM

## 2019-04-23 NOTE — Progress Notes (Signed)
Pt given information about applying for free Mirena device. Given cost of insertion/device at [facility] and [facility]. Pt plans to fill out scholarship application for Mirena. Explained pt will need to bring application and proof of income to the office for [place] to complete and fax. Explained that we will schedule an appt for insertion once we have the IUD in office.  Fleet Contras RN 04/23/19

## 2019-04-23 NOTE — Progress Notes (Signed)
Subjective:     Deanna Obrien is a 34 y.o. female who presents for a postpartum visit. She is 4 weeks postpartum following a spontaneous vaginal delivery. I have fully reviewed the prenatal and intrapartum course. The delivery was at 33.6 gestational weeks. Outcome: spontaneous vaginal delivery. Anesthesia: none. Postpartum course has been uncomplicated. Baby's course has been uncomplicated. Baby is feeding by breast and formulaRush Obrien. Bleeding no bleeding. Bowel function is abnormal: constipation. Bladder function is normal. Patient is not sexually active. Contraception method is abstinence. Postpartum depression screening: negative.     Review of Systems Pertinent items noted in HPI and remainder of comprehensive ROS otherwise negative.   Objective:    BP 138/77   Pulse 65   Wt 170 lb 6.4 oz (77.3 kg)   LMP 07/28/2018   Breastfeeding Yes   BMI 32.20 kg/m   General:  alert, cooperative and no distress   Breasts:  inspection negative, no nipple discharge or bleeding, no masses or nodularity palpable  Lungs: clear to auscultation bilaterally  Heart:  regular rate and rhythm  Abdomen: soft, non-tender; bowel sounds normal; no masses,  no organomegaly   Vulva:  normal  Vagina: normal vagina, no discharge, exudate, lesion, or erythema  Cervix:  multiparous appearance  Corpus: normal size, contour, position, consistency, mobility, non-tender  Adnexa:  not evaluated  Rectal Exam: Not performed.        Assessment:     Normal postpartum exam. Pap smear not done at today's visit.   Plan:    1. Contraception: condoms 2. Patient is medically cleared to resume all activities of daily living. Patient plans to obtain IUD with Health department 3. Follow up in: 2 weeks for glucola.

## 2019-04-23 NOTE — Progress Notes (Deleted)
Subjective:     Deanna Obrien is a 34 y.o. female who presents for a postpartum visit. She is 4 weeks 4 days postpartum following a spontaneous vaginal delivery. I have fully reviewed the prenatal and intrapartum course. The delivery was at 33w 6d. Outcome: spontaneous vaginal delivery. Anesthesia: none. Postpartum course has been ***. Baby's course has been ***. Baby is feeding by breast. Bleeding no bleeding. Bowel function is abnormal: pt reports some constipation. Bladder function is normal. Patient is not sexually active. Contraception method is IUD; has not been placed. Postpartum depression screening: negative.  {Common ambulatory SmartLinks:19316}  Review of Systems {ros; complete:30496}   Objective:    LMP 07/28/2018   General:  {gen appearance:16600}   Breasts:  {breast exam:1202::"inspection negative, no nipple discharge or bleeding, no masses or nodularity palpable"}  Lungs: {lung exam:16931}  Heart:  {heart exam:5510}  Abdomen: {abdomen exam:16834}   Vulva:  {labia exam:12198}  Vagina: {vagina exam:12200}  Cervix:  {cervix exam:14595}  Corpus: {uterus exam:12215}  Adnexa:  {adnexa exam:12223}  Rectal Exam: {rectal/vaginal exam:12274}        Assessment:    *** postpartum exam. Pap smear {done:10129} at today's visit.   Plan:    1. Contraception: {method:5051} 2. *** 3. Follow up in: {1-10:13787} {time; units:19136} or as needed.

## 2019-05-05 ENCOUNTER — Other Ambulatory Visit: Payer: Self-pay | Admitting: *Deleted

## 2019-05-05 DIAGNOSIS — Z8632 Personal history of gestational diabetes: Secondary | ICD-10-CM

## 2019-05-06 ENCOUNTER — Other Ambulatory Visit: Payer: Self-pay

## 2021-01-10 IMAGING — US US MFM OB FOLLOW-UP
1 series · 12 of 28 positions shown · non-contrast
Comparison: none

[Series 1: us mfm ob follow-up · 83 acquisitions, 12 frames shown]
[im 4/83]
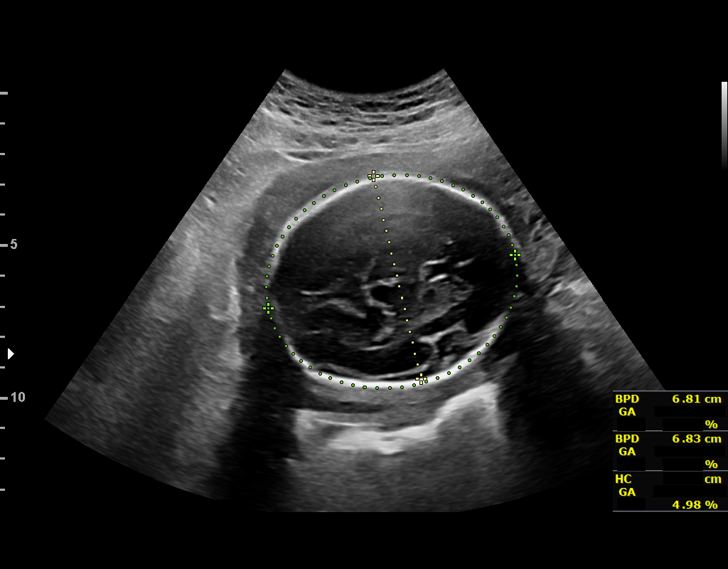
[im 10/83]
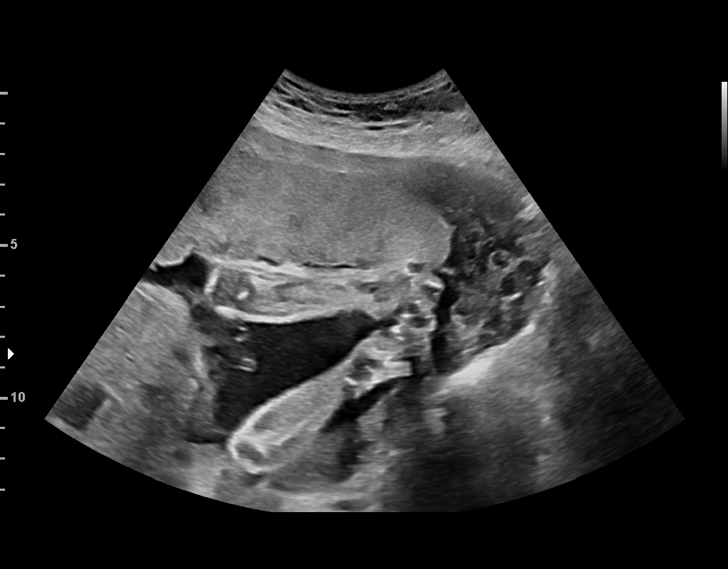
[im 16/83]
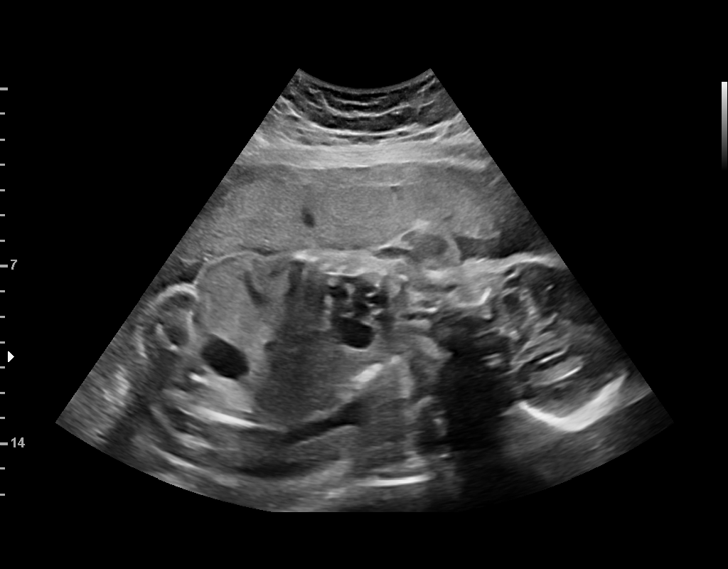
[im 25/83]
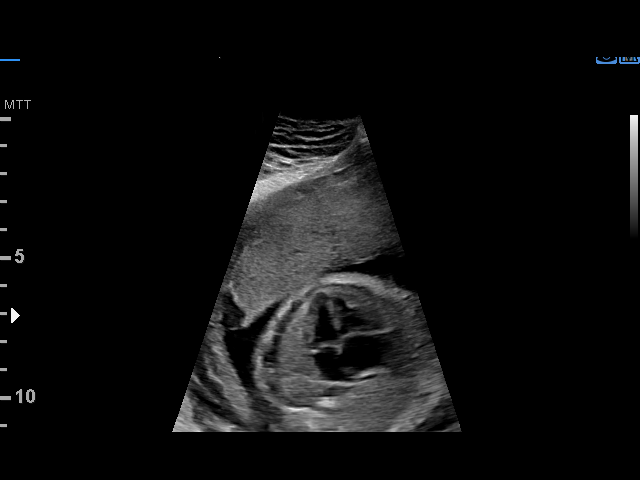
[im 31/83]
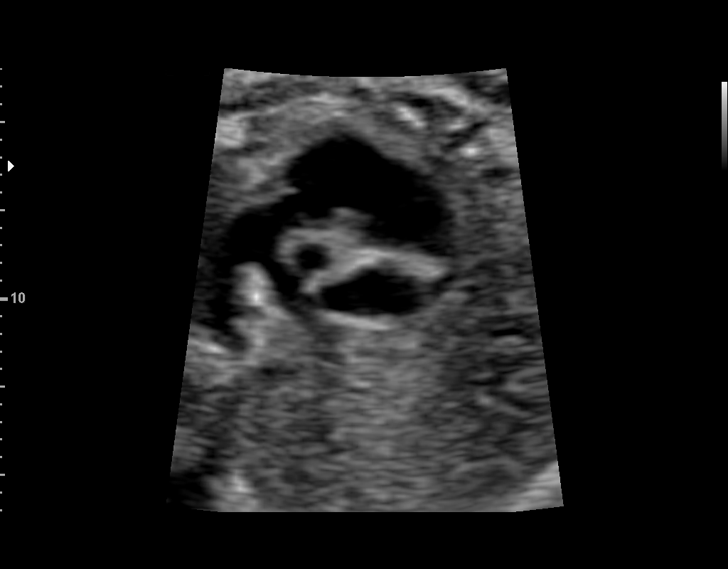
[im 37/83]
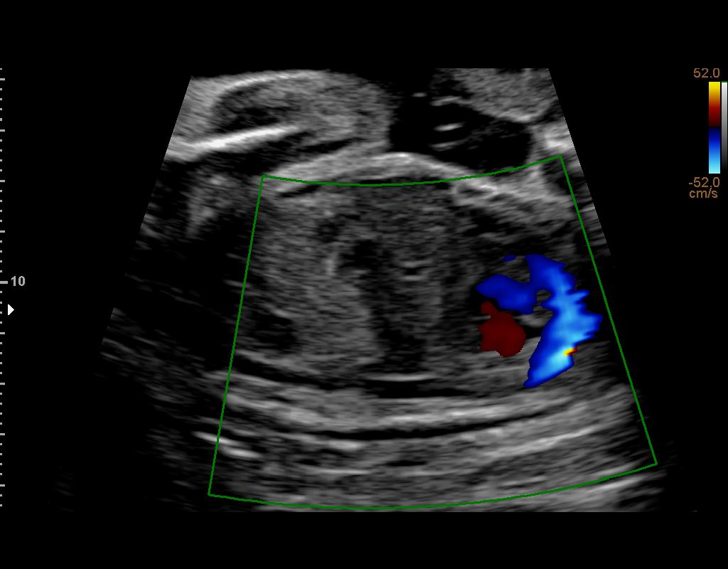
[im 46/83]
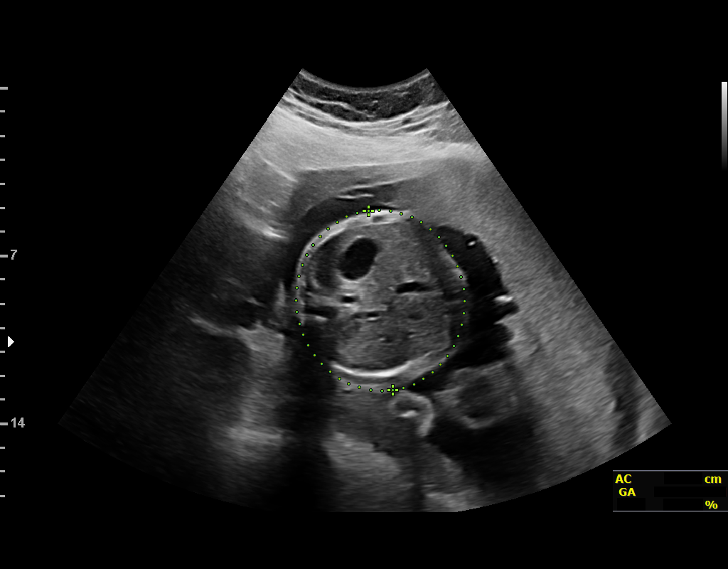
[im 52/83]
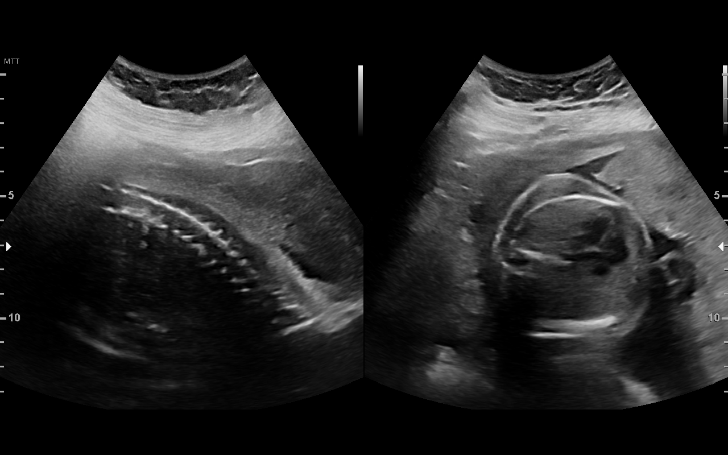
[im 58/83]
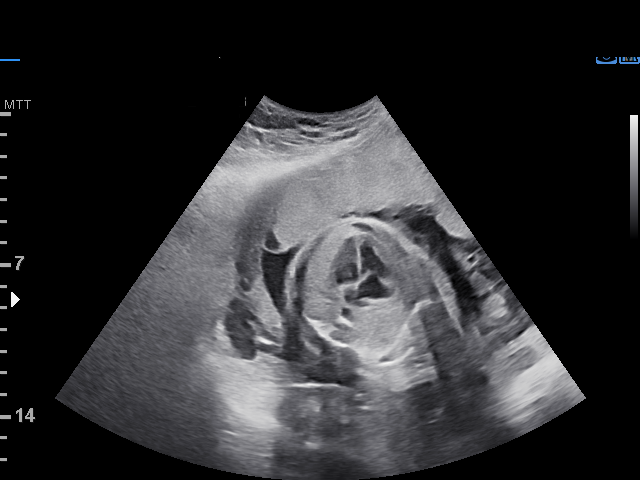
[im 67/83]
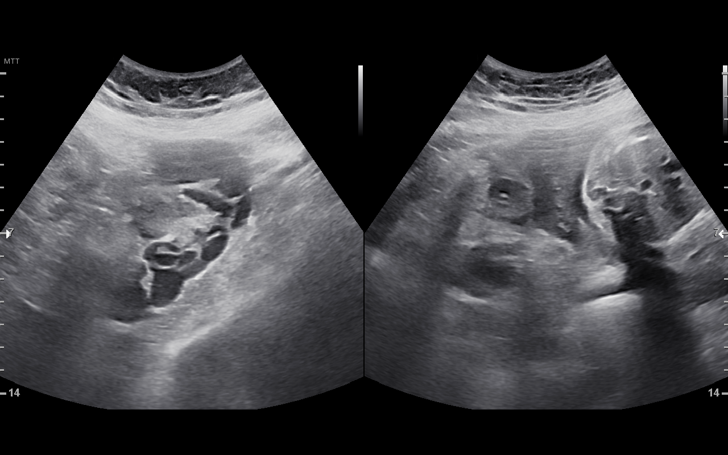
[im 73/83]
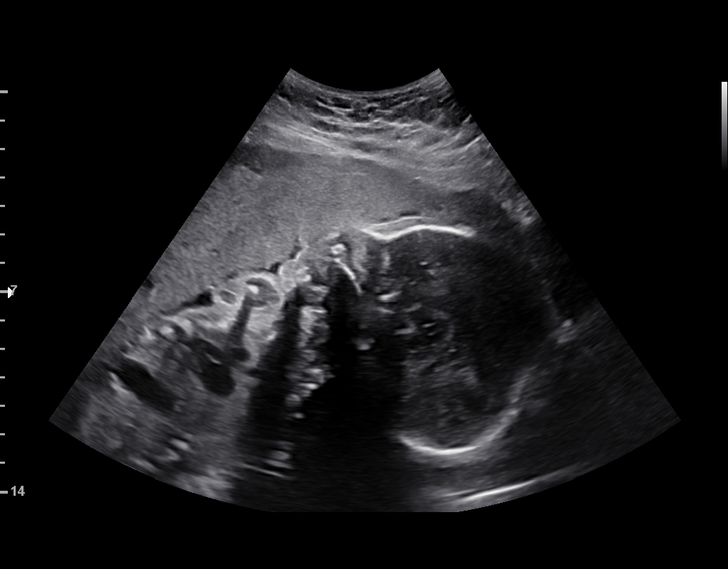
[im 79/83]
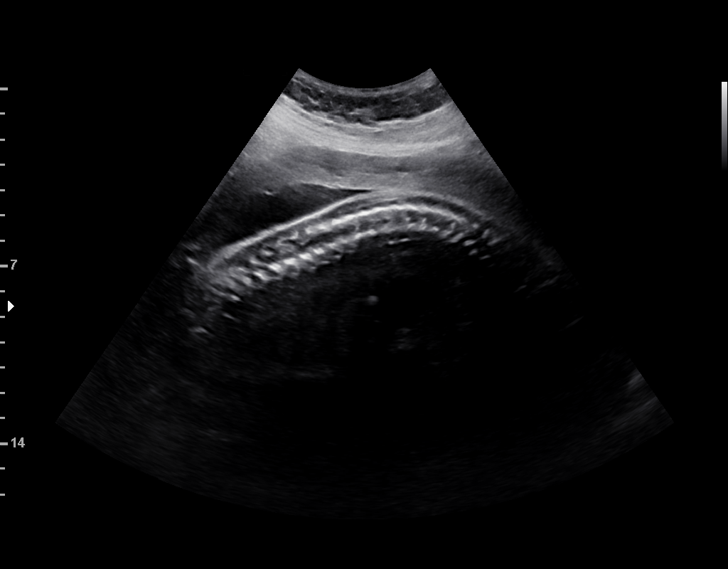

[12 of 28 positions shown; findings below may reference images not displayed]

----------------------------------------------------------------------

 ----------------------------------------------------------------------
Indications

  Antenatal follow-up for nonvisualized fetal
  anatomy
  (Negative AFP, Horizon and CF, Low Risk
  NIPS)
  27 weeks gestation of pregnancy
 ----------------------------------------------------------------------
Vital Signs

 BMI:
Fetal Evaluation

 Num Of Fetuses:         1
 Fetal Heart Rate(bpm):  158
 Cardiac Activity:       Observed
 Presentation:           Cephalic
 Placenta:               Anterior
 P. Cord Insertion:      Previously Visualized

 Amniotic Fluid
 AFI FV:      Within normal limits

                             Largest Pocket(cm)

Biometry

 BPD:        68  mm     G. Age:  27w 3d         51  %    CI:        78.82   %    70 - 86
                                                         FL/HC:      19.0   %    18.6 -
 HC:      242.2  mm     G. Age:  26w 2d          8  %    HC/AC:      1.04        1.05 -
 AC:      232.2  mm     G. Age:  27w 4d         60  %    FL/BPD:     67.5   %    71 - 87
 FL:       45.9  mm     G. Age:  25w 2d          3  %    FL/AC:      19.8   %    20 - 24
 Est. FW:     961  gm      2 lb 2 oz     24  %
OB History

 Gravidity:    3         Term:   2        Prem:   0        SAB:   0
 Ectopic:      0        Living:  2
Gestational Age

 LMP:           27w 0d        Date:  07/28/18                 EDD:   05/04/19
 U/S Today:     26w 5d                                        EDD:   05/06/19
 Best:          27w 0d     Det. By:  LMP  (07/28/18)          EDD:   05/04/19
Anatomy

 Cranium:               Appears normal         Aortic Arch:            Previously seen
 Cavum:                 Appears normal         Ductal Arch:            Appears normal
 Ventricles:            Appears normal         Diaphragm:              Appears normal
 Choroid Plexus:        Previously seen        Stomach:                Appears normal, left
                                                                       sided
 Cerebellum:            Previously seen        Abdomen:                Appears normal
 Posterior Fossa:       Previously seen        Abdominal Wall:         Previously seen
 Nuchal Fold:           Previously seen        Cord Vessels:           Appears normal (3
                                                                       vessel cord)
 Face:                  Orbits and profile     Kidneys:                Appear normal
                        previously seen
 Lips:                  Appears normal         Bladder:                Appears normal
 Thoracic:              Appears normal         Spine:                  Limited views
                                                                       appear normal
 Heart:                 Appears normal         Upper Extremities:      Previously seen
                        (4CH, axis, and
                        situs)
 RVOT:                  Appears normal         Lower Extremities:      Previously seen
 LVOT:                  Appears normal
Cervix Uterus Adnexa

 Cervix
 Not visualized (advanced GA >87wks)

 Uterus
 No abnormality visualized.

 Left Ovary
 Within normal limits. No adnexal mass visualized.

 Right Ovary
 Within normal limits. No adnexal mass visualized.

 Cul De Sac
 No free fluid seen.
Impression

 Patient returned for completion of fetal anatomy.
 Amniotic fluid is normal and good fetal activity is seen. Fetal
 growth is appropriate for gestational age. Fetal anatomical
 survey was completed and appears normal.
Recommendations
 Follow-up scans as clinically indicated.
                 Ostilien, Fontalus
